# Patient Record
Sex: Female | Born: 1985 | Race: Black or African American | Hispanic: No | Marital: Single | Smoking: Current every day smoker
Health system: Southern US, Community
[De-identification: ages and names within clinical notes are randomized; demographics above are authoritative.]

## PROBLEM LIST (undated history)

## (undated) ENCOUNTER — Inpatient Hospital Stay (HOSPITAL_COMMUNITY): Payer: Self-pay

## (undated) DIAGNOSIS — IMO0002 Reserved for concepts with insufficient information to code with codable children: Secondary | ICD-10-CM

## (undated) DIAGNOSIS — O210 Mild hyperemesis gravidarum: Secondary | ICD-10-CM

## (undated) DIAGNOSIS — J45909 Unspecified asthma, uncomplicated: Secondary | ICD-10-CM

## (undated) DIAGNOSIS — G43909 Migraine, unspecified, not intractable, without status migrainosus: Secondary | ICD-10-CM

## (undated) DIAGNOSIS — R87619 Unspecified abnormal cytological findings in specimens from cervix uteri: Secondary | ICD-10-CM

---
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
## 2007-05-04 ENCOUNTER — Emergency Department (HOSPITAL_COMMUNITY): Admission: EM | Admit: 2007-05-04 | Discharge: 2007-05-04 | Payer: Self-pay | Admitting: Emergency Medicine

## 2007-06-26 ENCOUNTER — Inpatient Hospital Stay (HOSPITAL_COMMUNITY): Admission: AD | Admit: 2007-06-26 | Discharge: 2007-06-26 | Payer: Self-pay | Admitting: Obstetrics and Gynecology

## 2007-07-01 ENCOUNTER — Inpatient Hospital Stay (HOSPITAL_COMMUNITY): Admission: AD | Admit: 2007-07-01 | Discharge: 2007-07-01 | Payer: Self-pay | Admitting: Obstetrics & Gynecology

## 2007-09-08 ENCOUNTER — Inpatient Hospital Stay (HOSPITAL_COMMUNITY): Admission: AD | Admit: 2007-09-08 | Discharge: 2007-09-08 | Payer: Self-pay | Admitting: Family Medicine

## 2007-10-12 ENCOUNTER — Ambulatory Visit (HOSPITAL_COMMUNITY): Admission: RE | Admit: 2007-10-12 | Discharge: 2007-10-12 | Payer: Self-pay | Admitting: Family Medicine

## 2007-12-04 ENCOUNTER — Ambulatory Visit: Payer: Self-pay | Admitting: Obstetrics and Gynecology

## 2007-12-04 ENCOUNTER — Inpatient Hospital Stay (HOSPITAL_COMMUNITY): Admission: AD | Admit: 2007-12-04 | Discharge: 2007-12-04 | Payer: Self-pay | Admitting: Obstetrics and Gynecology

## 2007-12-15 ENCOUNTER — Inpatient Hospital Stay (HOSPITAL_COMMUNITY): Admission: AD | Admit: 2007-12-15 | Discharge: 2007-12-15 | Payer: Self-pay | Admitting: Obstetrics

## 2008-01-18 ENCOUNTER — Inpatient Hospital Stay (HOSPITAL_COMMUNITY): Admission: AD | Admit: 2008-01-18 | Discharge: 2008-01-18 | Payer: Self-pay | Admitting: Obstetrics

## 2008-02-08 ENCOUNTER — Inpatient Hospital Stay (HOSPITAL_COMMUNITY): Admission: RE | Admit: 2008-02-08 | Discharge: 2008-02-11 | Payer: Self-pay | Admitting: Obstetrics

## 2009-01-15 ENCOUNTER — Inpatient Hospital Stay (HOSPITAL_COMMUNITY): Admission: AD | Admit: 2009-01-15 | Discharge: 2009-01-15 | Payer: Self-pay | Admitting: Obstetrics

## 2009-03-04 ENCOUNTER — Ambulatory Visit (HOSPITAL_COMMUNITY): Admission: RE | Admit: 2009-03-04 | Discharge: 2009-03-04 | Payer: Self-pay | Admitting: Family Medicine

## 2010-05-13 LAB — WET PREP, GENITAL: Yeast Wet Prep HPF POC: NONE SEEN

## 2010-05-13 LAB — DIFFERENTIAL
Lymphs Abs: 2.6 10*3/uL (ref 0.7–4.0)
Monocytes Relative: 4 % (ref 3–12)
Neutro Abs: 4.6 10*3/uL (ref 1.7–7.7)
Neutrophils Relative %: 61 % (ref 43–77)

## 2010-05-13 LAB — HEPATITIS B SURFACE ANTIGEN: Hepatitis B Surface Ag: NEGATIVE

## 2010-05-13 LAB — URINALYSIS, ROUTINE W REFLEX MICROSCOPIC
Hgb urine dipstick: NEGATIVE
Nitrite: NEGATIVE
Specific Gravity, Urine: >1.03 — ABNORMAL HIGH (ref 1.005–1.030)
Urobilinogen, UA: 0.2 mg/dL (ref 0.0–1.0)

## 2010-05-13 LAB — SICKLE CELL SCREEN: Sickle Cell Screen: NEGATIVE

## 2010-05-13 LAB — CBC
Platelets: 235 10*3/uL (ref 150–400)
RBC: 3.66 MIL/uL — ABNORMAL LOW (ref 3.87–5.11)
WBC: 7.6 10*3/uL (ref 4.0–10.5)

## 2010-05-13 LAB — TYPE AND SCREEN: ABO/RH(D): O NEG

## 2010-05-13 LAB — HIV ANTIBODY (ROUTINE TESTING W REFLEX): HIV: NONREACTIVE

## 2010-05-13 LAB — GC/CHLAMYDIA PROBE AMP, GENITAL
Chlamydia, DNA Probe: NEGATIVE
GC Probe Amp, Genital: NEGATIVE

## 2010-05-13 LAB — RPR: RPR Ser Ql: NONREACTIVE

## 2010-06-24 NOTE — Op Note (Signed)
NAME:  [name], [name]                 ACCOUNT NO.:  [id number]   MEDICAL RECORD NO.:  [id number]          PATIENT TYPE:  INP   LOCATION:  9138                          FACILITY:  [facility]   PHYSICIAN:  Kathreen Cosier, M.D.DATE OF BIRTH:  [birth year]   DATE OF PROCEDURE:  02/08/2008  DATE OF DISCHARGE:                               OPERATIVE REPORT   PREOPERATIVE DIAGNOSIS:  Previous cesarean section at term desiring  repeat.   POSTOPERATIVE DIAGNOSIS:  Previous cesarean section at term desiring  repeat.   SURGEON:  Kathreen Cosier, MD.   ANESTHESIA:  Spinal.   PROCEDURE:  The patient was placed on the operating table in supine  position after the spinal anesthesia administered.  Abdomen prepped and  draped.  Bladder emptied with a Foley catheter.  Transverse suprapubic  incision made through the old scar carried down through rectus fascia.  Fascia cleaned and incised to length of the incision.  The recti muscles  were retracted laterally.  Peritoneum was incised longitudinally.  Transverse incision made in the visceral peritoneum above the bladder.  Bladder mobilized inferiorly. Transverse lower uterine incision was  made.  The fluid was clear.  The patient delivered from the OP position  of a female, Apgar 9 and 9, weighing 7 pounds 8 ounces.  Placenta fundal  removed manually.  Uterine cavity cleaned with dry laps.  Uterine  incision closed in 1 layer with continuous suture of #1 chromic.  Hemostasis was satisfactory.  Bladder flap reattached with 2-0 chromic.  Uterus well contracted.  Tubes and ovaries normal.  Abdomen closed in  layers.  Peritoneum continuous suture of 0-chromic, fascia continuous  suture of 0-Dexon, and the skin closed with subcuticular stitch of 4-0  Monocryl.  Blood loss 500 mL.  The patient tolerated the procedure well  and taken to recovery room in good condition.           ______________________________  Kathreen Cosier, M.D.     BAM/MEDQ  D:   02/08/2008  T:  02/08/2008  Job:  [id number]

## 2010-06-27 NOTE — Discharge Summary (Signed)
NAME:  [name], [name]                 ACCOUNT NO.:  [id number]   MEDICAL RECORD NO.:  [id number]          PATIENT TYPE:  INP   LOCATION:  9138                          [facility]ILITY:  [facility]   PHYSICIAN:  Kathreen Cosier, M.D.DATE OF BIRTH:  [birth year]   DATE OF ADMISSION:  02/08/2008  DATE OF DISCHARGE:  02/11/2008                               DISCHARGE SUMMARY   The patient is a 25 year old gravida 2, para 1-0-0-1 who had a previous  C-section and was now at term, [facility] on February 14, 2008, and desired  repeat C-section.  She underwent repeat C-section on February 08, 2008,  at a 7-pound 8-ounce female, Apgar 9 and 9.  Placenta was sent to Labor  and Delivery.  There was a nuchal cord x1.  Blood loss was 500 mL.  Postoperatively, she did well.  Her hemoglobin was 8.3.  She was  discharged on the third postoperative day, ambulating, tolerating a  regular diet, on Tylox for pain and ferrous sulfate daily for anemia.   DISCHARGE DIAGNOSIS:  Status post repeat low-transverse cesarean  section, at term.           ______________________________  Kathreen Cosier, M.D.     BAM/MEDQ  D:  03/07/2008  T:  03/07/2008  Job:  [id number]

## 2010-08-06 ENCOUNTER — Inpatient Hospital Stay (INDEPENDENT_AMBULATORY_CARE_PROVIDER_SITE_OTHER)
Admission: RE | Admit: 2010-08-06 | Discharge: 2010-08-06 | Disposition: A | Discharge status: Home / Self Care | Payer: Self-pay | Source: Ambulatory Visit | Attending: Emergency Medicine | Admitting: Emergency Medicine

## 2010-08-06 DIAGNOSIS — R1032 Left lower quadrant pain: Secondary | ICD-10-CM

## 2010-08-06 DIAGNOSIS — N898 Other specified noninflammatory disorders of vagina: Secondary | ICD-10-CM

## 2010-08-06 LAB — POCT URINALYSIS DIP (DEVICE)
Bilirubin Urine: NEGATIVE
Hgb urine dipstick: NEGATIVE
Nitrite: NEGATIVE
Specific Gravity, Urine: 1.025 (ref 1.005–1.030)
Urobilinogen, UA: 0.2 mg/dL (ref 0.0–1.0)
pH: 6.5 (ref 5.0–8.0)

## 2010-08-06 LAB — WET PREP, GENITAL
Trich, Wet Prep: NONE SEEN
Yeast Wet Prep HPF POC: NONE SEEN

## 2010-08-07 LAB — GC/CHLAMYDIA PROBE AMP, GENITAL
Chlamydia, DNA Probe: NEGATIVE
GC Probe Amp, Genital: NEGATIVE

## 2010-11-05 LAB — WET PREP, GENITAL
Trich, Wet Prep: NONE SEEN
Yeast Wet Prep HPF POC: NONE SEEN

## 2010-11-05 LAB — RH IMMUNE GLOBULIN WORKUP (NOT WOMEN'S HOSP)
ABO/RH(D): O NEG
Antibody Screen: NEGATIVE

## 2010-11-05 LAB — CBC
MCV: 97
RBC: 3.84 — ABNORMAL LOW
WBC: 7.4

## 2010-11-05 LAB — HCG, QUANTITATIVE, PREGNANCY: hCG, Beta Chain, Quant, S: 108037 — ABNORMAL HIGH

## 2010-11-07 LAB — URINALYSIS, ROUTINE W REFLEX MICROSCOPIC
Ketones, ur: 40 — AB
Nitrite: NEGATIVE
Protein, ur: NEGATIVE
Urobilinogen, UA: 0.2

## 2010-11-10 LAB — URINALYSIS, ROUTINE W REFLEX MICROSCOPIC
Bilirubin Urine: NEGATIVE
Ketones, ur: NEGATIVE
Nitrite: NEGATIVE
Urobilinogen, UA: 0.2

## 2010-11-11 LAB — RH IMMUNE GLOBULIN WORKUP (NOT WOMEN'S HOSP)

## 2010-11-14 LAB — CBC
Hemoglobin: 8.5 g/dL — ABNORMAL LOW (ref 12.0–15.0)
MCV: 98.1 fL (ref 78.0–100.0)
Platelets: 171 10*3/uL (ref 150–400)
RBC: 2.53 MIL/uL — ABNORMAL LOW (ref 3.87–5.11)
RDW: 12.9 % (ref 11.5–15.5)
RDW: 13.3 % (ref 11.5–15.5)
WBC: 10.6 10*3/uL — ABNORMAL HIGH (ref 4.0–10.5)

## 2010-11-14 LAB — RPR: RPR Ser Ql: NONREACTIVE

## 2011-12-06 ENCOUNTER — Inpatient Hospital Stay (HOSPITAL_COMMUNITY)
Admission: AD | Admit: 2011-12-06 | Discharge: 2011-12-06 | Disposition: A | Discharge status: Home / Self Care | Payer: Medicaid Other | Source: Ambulatory Visit | Attending: Obstetrics | Admitting: Obstetrics

## 2011-12-06 ENCOUNTER — Encounter (HOSPITAL_COMMUNITY): Payer: Self-pay | Admitting: *Deleted

## 2011-12-06 DIAGNOSIS — O210 Mild hyperemesis gravidarum: Secondary | ICD-10-CM | POA: Insufficient documentation

## 2011-12-06 DIAGNOSIS — O26899 Other specified pregnancy related conditions, unspecified trimester: Secondary | ICD-10-CM

## 2011-12-06 DIAGNOSIS — O219 Vomiting of pregnancy, unspecified: Secondary | ICD-10-CM

## 2011-12-06 DIAGNOSIS — O99891 Other specified diseases and conditions complicating pregnancy: Secondary | ICD-10-CM | POA: Insufficient documentation

## 2011-12-06 DIAGNOSIS — R51 Headache: Secondary | ICD-10-CM | POA: Insufficient documentation

## 2011-12-06 HISTORY — DX: Unspecified asthma, uncomplicated: J45.909

## 2011-12-06 HISTORY — DX: Unspecified abnormal cytological findings in specimens from cervix uteri: R87.619

## 2011-12-06 HISTORY — DX: Reserved for concepts with insufficient information to code with codable children: IMO0002

## 2011-12-06 LAB — URINALYSIS, ROUTINE W REFLEX MICROSCOPIC
Hgb urine dipstick: NEGATIVE
Specific Gravity, Urine: 1.025 (ref 1.005–1.030)
Urobilinogen, UA: 0.2 mg/dL (ref 0.0–1.0)

## 2011-12-06 LAB — URINE MICROSCOPIC-ADD ON

## 2011-12-06 LAB — POCT PREGNANCY, URINE: Preg Test, Ur: POSITIVE — AB

## 2011-12-06 MED ORDER — BUTALBITAL-APAP-CAFFEINE 50-325-40 MG PO TABS: 1.0000 | ORAL_TABLET | Freq: Four times a day (QID) | ORAL | Status: DC | PRN

## 2011-12-06 MED ORDER — METOCLOPRAMIDE HCL 5 MG/ML IJ SOLN
10.0000 mg | Freq: Once | INTRAMUSCULAR | Status: AC
  Administered 2011-12-06: 10 mg via INTRAVENOUS
  Filled 2011-12-06: qty 2

## 2011-12-06 MED ORDER — DEXAMETHASONE SODIUM PHOSPHATE 10 MG/ML IJ SOLN
10.0000 mg | Freq: Once | INTRAMUSCULAR | Status: AC
  Administered 2011-12-06: 10 mg via INTRAVENOUS
  Filled 2011-12-06: qty 1

## 2011-12-06 MED ORDER — PROMETHAZINE HCL 25 MG/ML IJ SOLN
12.5000 mg | Freq: Once | INTRAMUSCULAR | Status: AC
  Administered 2011-12-06: 12.5 mg via INTRAVENOUS
  Filled 2011-12-06: qty 2

## 2011-12-06 MED ORDER — LACTATED RINGERS IV BOLUS (SEPSIS)
1000.0000 mL | Freq: Once | INTRAVENOUS | Status: AC
  Administered 2011-12-06: 1000 mL via INTRAVENOUS

## 2011-12-06 MED ORDER — METOCLOPRAMIDE HCL 10 MG PO TABS: 10.0000 mg | ORAL_TABLET | Freq: Four times a day (QID) | ORAL | Status: DC

## 2011-12-06 NOTE — MAU Note (Signed)
Pt reports she ahas had a headache/migraine in her right temple x 3 days. Took aleive but she vomited it up. reprots vision is blurry and unable to keep much of anything down.

## 2011-12-06 NOTE — MAU Provider Note (Signed)
History     CSN: [id number]  Arrival date and time: 12/06/11 1023   None     Chief Complaint  Patient presents with  . Headache   HPI 26 y.o. G5P3003 at [redacted] weeks EGA. Patient's last menstrual period was 10/25/2011.  Headache vomiting blurry vision x 4 day. Feels like migraine, had them in the past, years ago. Tried aleve, couldn't keep it down.    Past Medical History  Diagnosis Date  . Asthma   . Abnormal Pap smear     Past Surgical History  Procedure Date  . Cesarean section     No family history on file.  History  Substance Use Topics  . Smoking status: Current Every Day Smoker  . Smokeless tobacco: Not on file  . Alcohol Use: No    Allergies: No Known Allergies  Prescriptions prior to admission  Medication Sig Dispense Refill  . albuterol (PROVENTIL HFA;VENTOLIN HFA) 108 (90 BASE) MCG/ACT inhaler Inhale 2 puffs into the lungs every 6 (six) hours as needed. Asthma attacks      . beclomethasone (QVAR) 80 MCG/ACT inhaler Inhale 2 puffs into the lungs as needed. asthma      . naproxen sodium (ANAPROX) 220 MG tablet Take 440 mg by mouth daily as needed. pain        Review of Systems  Constitutional: Negative.   Eyes: Positive for blurred vision.  Respiratory: Negative.   Cardiovascular: Negative.   Gastrointestinal: Positive for nausea and vomiting. Negative for abdominal pain, diarrhea and constipation.  Genitourinary: Negative for dysuria, urgency, frequency, hematuria and flank pain.       Negative for vaginal bleeding, vaginal discharge, dyspareunia  Musculoskeletal: Negative.   Neurological: Positive for headaches.  Psychiatric/Behavioral: Negative.    Physical Exam   Blood pressure 104/52, pulse 85, temperature 97.9 F (36.6 C), temperature source Oral, resp. rate 18, height 4' 11.75" (1.518 m), weight 146 lb 9.6 oz (66.497 kg), last menstrual period 10/25/2011.  Physical Exam  Nursing note and vitals reviewed. Constitutional: She is oriented to  person, place, and time. She appears well-developed and well-nourished. No distress.  Cardiovascular: Normal rate.   Respiratory: Effort normal.  GI: Soft. There is no tenderness.  Musculoskeletal: Normal range of motion.  Neurological: She is alert and oriented to person, place, and time.  Skin: Skin is warm and dry.  Psychiatric: She has a normal mood and affect.    MAU Course  Procedures Results for orders placed during the hospital encounter of 12/06/11 (from the past 24 hour(s))  URINALYSIS, ROUTINE W REFLEX MICROSCOPIC     Status: Abnormal   Collection Time   12/06/11 10:25 AM      Component Value Range   Color, Urine YELLOW  YELLOW   APPearance CLEAR  CLEAR   Specific Gravity, Urine 1.025  1.005 - 1.030   pH 6.0  5.0 - 8.0   Glucose, UA NEGATIVE  NEGATIVE mg/dL   Hgb urine dipstick NEGATIVE  NEGATIVE   Bilirubin Urine NEGATIVE  NEGATIVE   Ketones, ur NEGATIVE  NEGATIVE mg/dL   Protein, ur NEGATIVE  NEGATIVE mg/dL   Urobilinogen, UA 0.2  0.0 - 1.0 mg/dL   Nitrite NEGATIVE  NEGATIVE   Leukocytes, UA TRACE (*) NEGATIVE  URINE MICROSCOPIC-ADD ON     Status: Abnormal   Collection Time   12/06/11 10:25 AM      Component Value Range   Squamous Epithelial / LPF FEW (*) RARE   WBC, UA 0-2  <3  WBC/hpf   Bacteria, UA FEW (*) RARE   Urine-Other MUCOUS PRESENT    POCT PREGNANCY, URINE     Status: Abnormal   Collection Time   12/06/11 10:43 AM      Component Value Range   Preg Test, Ur POSITIVE (*) NEGATIVE      . dexamethasone  10 mg Intravenous Once  . lactated ringers  1,000 mL Intravenous Once  . metoCLOPramide (REGLAN) injection  10 mg Intravenous Once  . promethazine  12.5 mg Intravenous Once   Pt feeling much better following IV meds.   Assessment and Plan   1. Headache in pregnancy   2. Nausea and vomiting in pregnancy       Medication List     As of 12/06/2011  6:17 PM    START taking these medications         butalbital-acetaminophen-caffeine  50-325-40 MG per tablet   Commonly known as: FIORICET, ESGIC   Take 1-2 tablets by mouth every 6 (six) hours as needed for headache.      metoCLOPramide 10 MG tablet   Commonly known as: REGLAN   Take 1 tablet (10 mg total) by mouth 4 (four) times daily.      CONTINUE taking these medications         albuterol 108 (90 BASE) MCG/ACT inhaler   Commonly known as: PROVENTIL HFA;VENTOLIN HFA      beclomethasone 80 MCG/ACT inhaler   Commonly known as: QVAR      STOP taking these medications         naproxen sodium 220 MG tablet   Commonly known as: ANAPROX          Where to get your medications    These are the prescriptions that you need to pick up. We sent them to a specific pharmacy, so you will need to go there to get them.   [facility] [address], [place] - [address]    [address] [place] [address]    Phone: [phone]        metoCLOPramide 10 MG tablet         You may get these medications from any pharmacy.         butalbital-acetaminophen-caffeine 50-325-40 MG per tablet            Follow-up Information    Follow up with MARSHALL,BERNARD A, MD. (start prenatal care as soon as possible)    Contact information:   [address] [place] [address] [phone]            FRAZIER,NATALIE 12/06/2011, 10:59 AM

## 2011-12-21 ENCOUNTER — Encounter (HOSPITAL_COMMUNITY): Payer: Self-pay | Admitting: *Deleted

## 2011-12-21 ENCOUNTER — Inpatient Hospital Stay (HOSPITAL_COMMUNITY)
Admission: AD | Admit: 2011-12-21 | Discharge: 2011-12-21 | Discharge status: Against Medical Advice | Payer: Medicaid Other | Source: Ambulatory Visit | Attending: Obstetrics | Admitting: Obstetrics

## 2011-12-21 DIAGNOSIS — G43909 Migraine, unspecified, not intractable, without status migrainosus: Secondary | ICD-10-CM | POA: Insufficient documentation

## 2011-12-21 DIAGNOSIS — O210 Mild hyperemesis gravidarum: Secondary | ICD-10-CM | POA: Insufficient documentation

## 2011-12-21 HISTORY — DX: Migraine, unspecified, not intractable, without status migrainosus: G43.909

## 2011-12-21 HISTORY — DX: Mild hyperemesis gravidarum: O21.0

## 2011-12-21 MED ORDER — PROMETHAZINE HCL 25 MG/ML IJ SOLN
12.5000 mg | Freq: Once | INTRAMUSCULAR | Status: AC
  Administered 2011-12-21: 12.5 mg via INTRAVENOUS
  Filled 2011-12-21: qty 1

## 2011-12-21 MED ORDER — METOCLOPRAMIDE HCL 5 MG/ML IJ SOLN
10.0000 mg | Freq: Once | INTRAMUSCULAR | Status: AC
  Administered 2011-12-21: 10 mg via INTRAVENOUS
  Filled 2011-12-21: qty 2

## 2011-12-21 MED ORDER — LACTATED RINGERS IV SOLN
INTRAVENOUS | Status: DC
  Administered 2011-12-21: 06:00:00 via INTRAVENOUS

## 2011-12-21 MED ORDER — DEXAMETHASONE SODIUM PHOSPHATE 10 MG/ML IJ SOLN: 10.0000 mg | Freq: Once | INTRAMUSCULAR | Status: DC

## 2011-12-21 NOTE — MAU Provider Note (Signed)
  History     CSN: [id number]  Arrival date and time: 12/21/11 7829   None     Chief Complaint  Patient presents with  . Headache   HPI  Pt is a G4P3003 at 8.1 wk IUP here with report of vomiting since 0530 this morning.  Vomiting is associated with headache, described by patient as a "migraine" that has occurred more frequently this pregnancy.  Headache is associated with photophobia.  No report of abdominal pain or vaginal bleeding.    Past Medical History  Diagnosis Date  . Asthma   . Abnormal Pap smear   . Hyperemesis complicating pregnancy, antepartum   . Migraines     Past Surgical History  Procedure Date  . Cesarean section     No family history on file.  History  Substance Use Topics  . Smoking status: Current Every Day Smoker  . Smokeless tobacco: Not on file  . Alcohol Use: No    Allergies: No Known Allergies  Prescriptions prior to admission  Medication Sig Dispense Refill  . albuterol (PROVENTIL HFA;VENTOLIN HFA) 108 (90 BASE) MCG/ACT inhaler Inhale 2 puffs into the lungs every 6 (six) hours as needed. Asthma attacks      . beclomethasone (QVAR) 80 MCG/ACT inhaler Inhale 2 puffs into the lungs as needed. asthma      . butalbital-acetaminophen-caffeine (FIORICET) 50-325-40 MG per tablet Take 1-2 tablets by mouth every 6 (six) hours as needed for headache.  20 tablet  0  . metoCLOPramide (REGLAN) 10 MG tablet Take 1 tablet (10 mg total) by mouth 4 (four) times daily.  30 tablet  1    Review of Systems  Constitutional: Negative for fever.  Eyes: Positive for blurred vision and photophobia. Negative for double vision.  Gastrointestinal: Positive for nausea and vomiting. Negative for abdominal pain.  Neurological: Positive for headaches.  All other systems reviewed and are negative.   Physical Exam   Blood pressure 133/70, pulse 80, temperature 98.3 F (36.8 C), temperature source Oral, resp. rate 20, height 4\' 11"  (1.499 m), weight 68.493 kg (151  lb), last menstrual period 10/25/2011, SpO2 100.00%.  Physical Exam  Constitutional: She is oriented to person, place, and time. She appears well-developed and well-nourished.       Tearful; vomiting multiple times in room.  HENT:  Head: Normocephalic.  Mouth/Throat: Mucous membranes are dry.  Neck: Normal range of motion. Neck supple.  Cardiovascular: Normal rate, regular rhythm and normal heart sounds.   Respiratory: Effort normal and breath sounds normal. No respiratory distress.  GI: Soft. There is no tenderness.  Genitourinary: No bleeding around the vagina. No vaginal discharge found.  Musculoskeletal: Normal range of motion. She exhibits no edema.  Neurological: She is alert and oriented to person, place, and time. She has normal reflexes.  Skin: Skin is warm and dry. She is not diaphoretic.    MAU Course  Procedures  IV LR 1000cc 10 mg IV Reglan 12.5 mg IV Phenergan  Pt vomiting stops, however pt states she wants to leave and begin attempting to take out IV; pt encouraged to stay for medication to help her relax > pt refuses medication.  Husband at bedside states this is her "acting out" and is common.  Pt agrees to stay until 0715 to assess for effects of medication.  Assessment and Plan  Migraine affecting Pregnancy  Pt leaves AMA without signing paperwork. [facility]    [facility] 12/21/2011, 6:12 AM

## 2011-12-21 NOTE — MAU Note (Signed)
Pt once again states she is going to take the IV out, pulling at tape. IV removed. Muhammed CNM at bedside. Pt goes around bed and starts putting her clothes on, continues to state she just wants to leave and go home and rest. Pt then takes a pill bottle out of her purse and takes 2 pills from it and takes them, states it is what we gave her the last time she was here, ask pt not to take meds but pt goes to sink and takes meds, states she is going home and go to bed. Stallings,AC at bedside, pt told she can not leave right after meds are given, pt states we can not keep here because she is fine. Pt 's sig other at bedside as well as provider and Stallings,AC. Discussion between [facility], pt, and pt's husband about hospital police and pt safety and leaving before we can assess effectiveness of meds as well as possible side effects. Eventually pt agrees to stay until 0715.

## 2011-12-21 NOTE — MAU Note (Signed)
After meds given, pt crying harder, twisting in bed, states she just wants to go home, wants IV out and wants to go home, provider at bedside. Reassurance given, attempt to convince pt to stay for further meds as needed and further eval. Pt getting out of bed, demanding to get iv out "or i will take it out myself", continue to attempt to reassure pt, provider and other staff rn at bedside. Pt sig other has gone to lobby for a minute.

## 2011-12-21 NOTE — MAU Note (Signed)
Pt going back and forth to restroom to vomit, heaving and crying at times. Muhammad,CNM at bedside.

## 2011-12-21 NOTE — MAU Note (Signed)
Prior to meds pt crying , states "i just want the pain to go away", rolling in bed.

## 2011-12-21 NOTE — MAU Note (Signed)
Pt walked out of MAU without speaking or acknowledging staff. RN attempted to stop pt to get her to sign papers, however pt ignored RN and walked out the door and got into car at MAU entrance. [facility], CNM informed.

## 2011-12-21 NOTE — MAU Note (Signed)
Pt reports she awakened with a "really, really, really bad headache" about 15 minutes ago, complains of blurred vision. nausea

## 2011-12-29 LAB — OB RESULTS CONSOLE HIV ANTIBODY (ROUTINE TESTING): HIV: NONREACTIVE

## 2011-12-29 LAB — OB RESULTS CONSOLE RPR: RPR: NONREACTIVE

## 2012-06-19 ENCOUNTER — Encounter (HOSPITAL_COMMUNITY): Payer: Self-pay

## 2012-06-19 ENCOUNTER — Inpatient Hospital Stay (HOSPITAL_COMMUNITY)
Admission: AD | Admit: 2012-06-19 | Discharge: 2012-06-19 | Disposition: A | Discharge status: Home / Self Care | Payer: Medicaid Other | Source: Ambulatory Visit | Attending: Obstetrics | Admitting: Obstetrics

## 2012-06-19 DIAGNOSIS — R109 Unspecified abdominal pain: Secondary | ICD-10-CM | POA: Insufficient documentation

## 2012-06-19 DIAGNOSIS — O2673 Subluxation of symphysis (pubis) in the puerperium: Secondary | ICD-10-CM

## 2012-06-19 DIAGNOSIS — O99891 Other specified diseases and conditions complicating pregnancy: Secondary | ICD-10-CM | POA: Insufficient documentation

## 2012-06-19 LAB — URINALYSIS, ROUTINE W REFLEX MICROSCOPIC
Bilirubin Urine: NEGATIVE
Ketones, ur: >80 mg/dL — AB
Nitrite: NEGATIVE
Specific Gravity, Urine: 1.02 (ref 1.005–1.030)
Urobilinogen, UA: 0.2 mg/dL (ref 0.0–1.0)

## 2012-06-19 LAB — WET PREP, GENITAL: Trich, Wet Prep: NONE SEEN

## 2012-06-19 MED ORDER — CYCLOBENZAPRINE HCL 10 MG PO TABS
10.0000 mg | ORAL_TABLET | Freq: Once | ORAL | Status: AC
  Administered 2012-06-19: 10 mg via ORAL
  Filled 2012-06-19: qty 1

## 2012-06-19 NOTE — MAU Provider Note (Signed)
History     CSN: [id number]  Arrival date and time: 06/19/12 2131   First Provider Initiated Contact with Patient 06/19/12 2220      Chief Complaint  Patient presents with  . Abdominal Pain   HPI  [name] is a 27 y.o. G3P2002 at [redacted]w[redacted]d who presents today with pain "right at that bone" (as she points to pubic bone). She states that the pain is worse when she abducts her legs (such as putting on pants/underpants) and states that her boyfriend has to help her get dressed. She rates the pain 8/10 while texting on her phone, and states that it is constant. She took a tylenol #3 today and states that she slept for a few hours, but when she woke up the pain had returned. She denies any VB or LOF and states that the baby has been moving normally.   Past Medical History  Diagnosis Date  . Asthma   . Abnormal Pap smear   . Hyperemesis complicating pregnancy, antepartum   . Migraines     Past Surgical History  Procedure Laterality Date  . Cesarean section      Family History  Problem Relation Age of Onset  . Hypertension Father   . Diabetes Father   . Cancer Maternal Grandmother 56    ovarian  . Cancer Paternal Grandmother     brain tumor    History  Substance Use Topics  . Smoking status: Current Every Day Smoker -- 0.50 packs/day for 13 years    Types: Cigarettes  . Smokeless tobacco: Not on file  . Alcohol Use: No    Allergies:  Allergies  Allergen Reactions  . Latex Swelling and Other (See Comments)    Turn red    Prescriptions prior to admission  Medication Sig Dispense Refill  . acetaminophen-codeine (TYLENOL #3) 300-30 MG per tablet Take 1 tablet by mouth every 4 (four) hours as needed for pain.      . promethazine (PHENERGAN) 25 MG tablet Take 25 mg by mouth every 6 (six) hours as needed for nausea.      Marland Kitchen albuterol (PROVENTIL HFA;VENTOLIN HFA) 108 (90 BASE) MCG/ACT inhaler Inhale 2 puffs into the lungs every 6 (six) hours as needed. Asthma attacks       . beclomethasone (QVAR) 80 MCG/ACT inhaler Inhale 2 puffs into the lungs as needed. asthma        Review of Systems  Constitutional: Negative for fever.  Eyes: Negative for blurred vision.  Respiratory: Negative for shortness of breath.   Cardiovascular: Negative for chest pain.  Gastrointestinal: Positive for abdominal pain (pubis pain). Negative for nausea, vomiting, diarrhea and constipation.  Genitourinary: Negative for dysuria, urgency and frequency.  Musculoskeletal: Negative for myalgias.  Neurological: Negative for dizziness and headaches.   Physical Exam   Blood pressure 116/59, pulse 101, temperature 98 F (36.7 C), resp. rate 18, height 4\' 11"  (1.499 m), weight 72.938 kg (160 lb 12.8 oz), last menstrual period 10/25/2011, SpO2 99.00%.  Physical Exam  Nursing note and vitals reviewed. Constitutional: She is oriented to person, place, and time. She appears well-developed and well-nourished. No distress.  Cardiovascular: Normal rate.   Respiratory: Effort normal.  GI: Soft. She exhibits no distension. There is no tenderness.  Genitourinary:   External: no lesion Cervix: 1/thick/high Uterus: AGA  Neurological: She is alert and oriented to person, place, and time.  Skin: Skin is warm and dry.  Psychiatric: She has a normal mood and affect.  FHT: 140, moderate with 15x15 accels no decels Toco: no UCs  MAU Course  Procedures  Results for orders placed during the hospital encounter of 06/19/12 (from the past 24 hour(s))  URINALYSIS, ROUTINE W REFLEX MICROSCOPIC     Status: Abnormal   Collection Time    06/19/12  9:43 PM      Result Value Range   Color, Urine YELLOW  YELLOW   APPearance CLEAR  CLEAR   Specific Gravity, Urine 1.020  1.005 - 1.030   pH 6.5  5.0 - 8.0   Glucose, UA NEGATIVE  NEGATIVE mg/dL   Hgb urine dipstick TRACE (*) NEGATIVE   Bilirubin Urine NEGATIVE  NEGATIVE   Ketones, ur >80 (*) NEGATIVE mg/dL   Protein, ur NEGATIVE  NEGATIVE mg/dL    Urobilinogen, UA 0.2  0.0 - 1.0 mg/dL   Nitrite NEGATIVE  NEGATIVE   Leukocytes, UA SMALL (*) NEGATIVE  URINE MICROSCOPIC-ADD ON     Status: Abnormal   Collection Time    06/19/12  9:43 PM      Result Value Range   Squamous Epithelial / LPF RARE  RARE   WBC, UA 0-2  <3 WBC/hpf   RBC / HPF 0-2  <3 RBC/hpf   Bacteria, UA FEW (*) RARE     Assessment and Plan   1. 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 Gaynell Face as scheduled Labor precautions given Fetal kick counts  Tawnya Crook 06/19/2012, 10:33 PM

## 2012-06-19 NOTE — MAU Note (Signed)
Constant sharp abdominal pain since this afternoon where c/section scars are. Unsure if having contractions. Denies leaking of fluid or vaginal bleeding. Positive fetal movement.

## 2012-06-23 NOTE — Op Note (Signed)
NAMESAOIRSE, LEGERE                 ACCOUNT NO.:  [id number]  MEDICAL RECORD NO.:  [id number]  LOCATION:  ZO10                          FACILITY:  [facility]  PHYSICIAN:  Marlowe Kays, M.D.  DATE OF BIRTH:  1985-10-26  DATE OF PROCEDURE:  06/22/2012 DATE OF DISCHARGE:  06/19/2012                              OPERATIVE REPORT   PREOPERATIVE DIAGNOSIS:  Pain and swelling, right knee with suspected loose body.  POSTOPERATIVE DIAGNOSIS:  Pain and swelling, right knee with suspected loose body.  OPERATION:  Right knee arthroscopy with generalized joint debridement and removal of suspected loose body.  SURGEON:  Marlowe Kays, M.D.  ASSISTANT:  Nurse.  ANESTHESIA:  General.  PATHOLOGY AND JUSTIFICATION FOR PROCEDURE:  This woman's history was such that she had a badly fractured right patella, which failed to heal (she is a smoker) and I then performed partial patellectomy with repair of the patellar mechanism.  She has done well until roughly 3 to 4 weeks ago when she developed pain and swelling in her knee with no apparent injury.  X-rays in my office demonstrated what appear to be a loose body, which had avulsed off the inferior portion of the patellar repair. It was felt that she had a mechanical problem in the knee and this arthroscopic procedure was indicated for this.  DESCRIPTION OF PROCEDURE:  Satisfactory general anesthesia, Ace wrap and knee support to left lower extremity, pneumatic tourniquet to right lower extremity with the leg Esmarch'ed out nonsterilely and tourniquet inflated to 325 mmHg.  Thigh stabilizer was applied.  Right leg was then prepped with DuraPrep from stabilizer to ankle and draped in sterile field.  Time-out performed.  Superior medial saline inflow.  First, an anterolateral portal, medial compartment knee joint was evaluated.  She had a good bit of reactive fibrosis in the anterior third of the joint, which I cleaned out with 3.5 shaver, went down  to the medial meniscus, which was perhaps slightly scarred but was not frankly torn.  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  Posteriorly, she had normal femoral condyle and normal medial meniscus.  Looking at the medial gutter and suprapatellar area, I found a large plica or a band type structure and also some wear on the articular surface of the patella.  It was difficult to get to these structures from this portal.  Consequently, I reversed portals.  She had a good bit of reactive fibrosis in the anterolateral joint and anterior to the ACL.  I cleaned all this out with the 3.5 shaver.  During this process, the suction apparatus did clot at one point, which made [place] suspicious that the fragment may have been evacuated.  Her ACL was intact.  She did have some bleeding anterior to the ACL at the femur and I used the ArthroCare vaporizer to eradicate this.  I then looked up in the lateral gutter and suprapatellar area.  I shaved the medial facet of the patella and then used the ArthroCare vaporizer to remove the postsurgical plica and soft tissue in the anterior medial aspect of her  joint until all this was cleaned out.  Since I could not definitely say I had found a fragment, I took a lateral x-ray with portable C-arm and did not see any remaining fragment in the joint.  This concluded to my satisfaction that we had accomplished the desired goals of the surgery. The knee joint was irrigated to clear and all fluid possibly removed.  I closed the 2 entry portals with 4-0 nylon and then injected 20 mL of 0.5% Marcaine with adrenaline and 4 mg of morphine through the inflow apparatus, which I closed with 4-0 nylon as well.  Betadine, Adaptic, dry sterile dressing were applied. Tourniquet was released.  She tolerated the procedure well and was taken to the recovery room in  satisfactory condition with no known complications.          ______________________________ Marlowe Kays, M.D.     JA/MEDQ  D:  06/22/2012  T:  06/23/2012  Job:  [id number]

## 2012-06-28 ENCOUNTER — Other Ambulatory Visit: Payer: Self-pay | Admitting: Obstetrics

## 2012-07-13 MED ORDER — FENTANYL CITRATE 0.05 MG/ML IJ SOLN
INTRAMUSCULAR | Status: AC
  Filled 2012-07-13: qty 2

## 2012-07-13 MED ORDER — MORPHINE SULFATE 0.5 MG/ML IJ SOLN
INTRAMUSCULAR | Status: AC
  Filled 2012-07-13: qty 10

## 2012-07-20 ENCOUNTER — Encounter (HOSPITAL_COMMUNITY): Payer: Self-pay | Admitting: Pharmacist

## 2012-07-23 NOTE — H&P (Signed)
NAME:  [name], [name] NO.:  [id number]  MEDICAL RECORD NO.:  [id number]  LOCATION:  PERIO                         FACILITY:  [facility]  PHYSICIAN:  Kathreen Cosier, M.D.DATE OF BIRTH:  1985-02-10  DATE OF ADMISSION:  06/19/2012 DATE OF DISCHARGE:                             HISTORY & PHYSICAL   HISTORY OF PRESENT ILLNESS:  The patient is a 27 year old, gravida 3, para 2-0-0-2, who had 2 previous C-sections.  Her due date is July 29, 2012.  She is O negative and received RhoGAM.  The patient is in for repeat C-section.  PAST MEDICAL HISTORY:  Negative.  PAST SURGICAL HISTORY:  Two C-sections in the past.  Negative GBS.  SOCIAL HISTORY:  She smokes half a pack of cigarettes a day.  REVIEW OF SYSTEMS:  Noncontributory.  PHYSICAL EXAMINATION:  GENERAL:  Well-developed female, in no distress. HEENT:  Negative. LUNGS:  Clear to P and A. HEART:  Regular rhythm.  No murmurs, no gallops. BREASTS:  Negative. ABDOMEN:  Term. PELVIC:  Cervix closed. EXTREMITIES:  Legs negative.          ______________________________ Kathreen Cosier, M.D.     BAM/MEDQ  D:  07/22/2012  T:  07/23/2012  Job:  [id number]

## 2012-07-25 ENCOUNTER — Encounter (HOSPITAL_COMMUNITY): Payer: Self-pay

## 2012-07-25 ENCOUNTER — Encounter (HOSPITAL_COMMUNITY)
Admission: RE | Admit: 2012-07-25 | Discharge: 2012-07-25 | Disposition: A | Discharge status: Home / Self Care | Payer: Medicaid Other | Source: Ambulatory Visit | Attending: Obstetrics | Admitting: Obstetrics

## 2012-07-25 LAB — CBC
MCHC: 33.8 g/dL (ref 30.0–36.0)
RDW: 12.7 % (ref 11.5–15.5)

## 2012-07-25 LAB — TYPE AND SCREEN: ABO/RH(D): O NEG

## 2012-07-25 NOTE — Patient Instructions (Addendum)
20 [name]  07/25/2012   Your procedure is scheduled on:  07/26/12  Enter through the Main Entrance of [facility] at 3PM/ AM.  Pick up the phone at the desk and dial [phone].   Call this number if you have problems the morning of surgery: [phone]   Remember:   Do not eat food:After Midnight.  Do not drink clear liquids: 4 Hours before arrival.  Take these medicines the morning of surgery with A SIP OF WATER: NA   Do not wear jewelry, make-up or nail polish.  Do not wear lotions, powders, or perfumes. You may wear deodorant.  Do not shave 48 hours prior to surgery.  Do not bring valuables to the hospital.  [facility] is not responsible                  for any belongings or valuables brought to the hospital.  Contacts, dentures or bridgework may not be worn into surgery.  Leave suitcase in the car. After surgery it may be brought to your room.  For patients admitted to the hospital, checkout time is 11:00 AM the day of                discharge.   Patients discharged the day of surgery will not be allowed to drive                   home.  Name and phone number of your driver: NA  Special Instructions: Shower using CHG 2 nights before surgery and the night before surgery.  If you shower the day of surgery use CHG.  Use special wash - you have one bottle of CHG for all showers.  You should use approximately 1/3 of the bottle for each shower.   Please read over the following fact sheets that you were given: Surgical Site Infection Prevention

## 2012-07-26 ENCOUNTER — Inpatient Hospital Stay (HOSPITAL_COMMUNITY)
Admission: RE | Admit: 2012-07-26 | Discharge: 2012-07-29 | DRG: 766 | Disposition: A | Discharge status: Home / Self Care | Payer: Medicaid Other | Source: Ambulatory Visit | Attending: Obstetrics | Admitting: Obstetrics

## 2012-07-26 ENCOUNTER — Encounter: Payer: Medicaid Other | Admitting: Obstetrics

## 2012-07-26 ENCOUNTER — Inpatient Hospital Stay (HOSPITAL_COMMUNITY): Payer: Medicaid Other | Admitting: Anesthesiology

## 2012-07-26 ENCOUNTER — Encounter (HOSPITAL_COMMUNITY)
Admission: RE | Disposition: A | Discharge status: Home / Self Care | Payer: Self-pay | Source: Ambulatory Visit | Attending: Obstetrics

## 2012-07-26 ENCOUNTER — Encounter (HOSPITAL_COMMUNITY): Payer: Self-pay | Admitting: Anesthesiology

## 2012-07-26 DIAGNOSIS — O34219 Maternal care for unspecified type scar from previous cesarean delivery: Principal | ICD-10-CM | POA: Diagnosis present

## 2012-07-26 DIAGNOSIS — O99334 Smoking (tobacco) complicating childbirth: Secondary | ICD-10-CM | POA: Diagnosis present

## 2012-07-26 LAB — RPR: RPR Ser Ql: NONREACTIVE

## 2012-07-26 SURGERY — Surgical Case
Anesthesia: Spinal | Site: Abdomen | Wound class: Clean Contaminated

## 2012-07-26 MED ORDER — NALBUPHINE SYRINGE 5 MG/0.5 ML
INJECTION | INTRAMUSCULAR | Status: AC
  Filled 2012-07-26: qty 0.5

## 2012-07-26 MED ORDER — MENTHOL 3 MG MT LOZG: 1.0000 | LOZENGE | OROMUCOSAL | Status: DC | PRN

## 2012-07-26 MED ORDER — LACTATED RINGERS IV SOLN
INTRAVENOUS | Status: DC
  Administered 2012-07-26: 21:00:00 via INTRAVENOUS

## 2012-07-26 MED ORDER — SIMETHICONE 80 MG PO CHEW
80.0000 mg | CHEWABLE_TABLET | Freq: Three times a day (TID) | ORAL | Status: DC
  Administered 2012-07-27 – 2012-07-29 (×6): 80 mg via ORAL

## 2012-07-26 MED ORDER — FENTANYL CITRATE 0.05 MG/ML IJ SOLN
INTRAMUSCULAR | Status: DC | PRN
  Administered 2012-07-26: 75 ug via INTRAVENOUS
  Administered 2012-07-26 (×2): 50 ug via INTRAVENOUS

## 2012-07-26 MED ORDER — HYDROMORPHONE HCL PF 1 MG/ML IJ SOLN
INTRAMUSCULAR | Status: AC
  Filled 2012-07-26: qty 1

## 2012-07-26 MED ORDER — DIPHENHYDRAMINE HCL 25 MG PO CAPS
25.0000 mg | ORAL_CAPSULE | ORAL | Status: DC | PRN
  Filled 2012-07-26: qty 1

## 2012-07-26 MED ORDER — OXYTOCIN 40 UNITS IN LACTATED RINGERS INFUSION - SIMPLE MED: 62.5000 mL/h | INTRAVENOUS | Status: AC

## 2012-07-26 MED ORDER — MEPERIDINE HCL 25 MG/ML IJ SOLN: 6.2500 mg | INTRAMUSCULAR | Status: DC | PRN

## 2012-07-26 MED ORDER — SIMETHICONE 80 MG PO CHEW
80.0000 mg | CHEWABLE_TABLET | ORAL | Status: DC | PRN
  Administered 2012-07-27 – 2012-07-28 (×2): 80 mg via ORAL

## 2012-07-26 MED ORDER — MORPHINE SULFATE (PF) 0.5 MG/ML IJ SOLN
INTRAMUSCULAR | Status: DC | PRN
  Administered 2012-07-26: .15 mg via INTRATHECAL

## 2012-07-26 MED ORDER — KETOROLAC TROMETHAMINE 30 MG/ML IJ SOLN
30.0000 mg | Freq: Four times a day (QID) | INTRAMUSCULAR | Status: AC | PRN
  Administered 2012-07-26: 30 mg via INTRAVENOUS

## 2012-07-26 MED ORDER — DIBUCAINE 1 % RE OINT
1.0000 "application " | TOPICAL_OINTMENT | RECTAL | Status: DC | PRN
  Administered 2012-07-27 – 2012-07-28 (×2): 1 via RECTAL
  Filled 2012-07-26 (×2): qty 28

## 2012-07-26 MED ORDER — ONDANSETRON HCL 4 MG/2ML IJ SOLN: 4.0000 mg | Freq: Three times a day (TID) | INTRAMUSCULAR | Status: DC | PRN

## 2012-07-26 MED ORDER — FENTANYL CITRATE 0.05 MG/ML IJ SOLN
INTRAMUSCULAR | Status: AC
  Filled 2012-07-26: qty 2

## 2012-07-26 MED ORDER — PRENATAL MULTIVITAMIN CH
1.0000 | ORAL_TABLET | Freq: Every day | ORAL | Status: DC
  Administered 2012-07-27 – 2012-07-28 (×2): 1 via ORAL
  Filled 2012-07-26 (×2): qty 1

## 2012-07-26 MED ORDER — SODIUM CHLORIDE 0.9 % IJ SOLN: 3.0000 mL | INTRAMUSCULAR | Status: DC | PRN

## 2012-07-26 MED ORDER — SCOPOLAMINE 1 MG/3DAYS TD PT72
MEDICATED_PATCH | TRANSDERMAL | Status: AC
  Administered 2012-07-26: 1.5 mg via TRANSDERMAL
  Filled 2012-07-26: qty 1

## 2012-07-26 MED ORDER — ONDANSETRON HCL 4 MG PO TABS: 4.0000 mg | ORAL_TABLET | ORAL | Status: DC | PRN

## 2012-07-26 MED ORDER — OXYCODONE-ACETAMINOPHEN 5-325 MG PO TABS
1.0000 | ORAL_TABLET | ORAL | Status: DC | PRN
  Administered 2012-07-27 – 2012-07-28 (×4): 1 via ORAL
  Administered 2012-07-28 (×4): 2 via ORAL
  Administered 2012-07-29: 1 via ORAL
  Filled 2012-07-26 (×8): qty 1
  Filled 2012-07-26 (×2): qty 2
  Filled 2012-07-26: qty 1

## 2012-07-26 MED ORDER — ONDANSETRON HCL 4 MG/2ML IJ SOLN
INTRAMUSCULAR | Status: DC | PRN
  Administered 2012-07-26: 4 mg via INTRAVENOUS

## 2012-07-26 MED ORDER — ONDANSETRON HCL 4 MG/2ML IJ SOLN: 4.0000 mg | INTRAMUSCULAR | Status: DC | PRN

## 2012-07-26 MED ORDER — KETOROLAC TROMETHAMINE 30 MG/ML IJ SOLN
INTRAMUSCULAR | Status: AC
  Filled 2012-07-26: qty 1

## 2012-07-26 MED ORDER — LACTATED RINGERS IV SOLN
40.0000 [IU] | INTRAVENOUS | Status: DC | PRN
  Administered 2012-07-26: 40 [IU] via INTRAVENOUS

## 2012-07-26 MED ORDER — NALOXONE HCL 0.4 MG/ML IJ SOLN: 0.4000 mg | INTRAMUSCULAR | Status: DC | PRN

## 2012-07-26 MED ORDER — WITCH HAZEL-GLYCERIN EX PADS
1.0000 "application " | MEDICATED_PAD | CUTANEOUS | Status: DC | PRN
  Administered 2012-07-27: 1 via TOPICAL

## 2012-07-26 MED ORDER — SCOPOLAMINE 1 MG/3DAYS TD PT72
1.0000 | MEDICATED_PATCH | Freq: Once | TRANSDERMAL | Status: DC
  Administered 2012-07-26: 1.5 mg via TRANSDERMAL

## 2012-07-26 MED ORDER — PHENYLEPHRINE 40 MCG/ML (10ML) SYRINGE FOR IV PUSH (FOR BLOOD PRESSURE SUPPORT)
PREFILLED_SYRINGE | INTRAVENOUS | Status: AC
  Filled 2012-07-26: qty 5

## 2012-07-26 MED ORDER — EPHEDRINE 5 MG/ML INJ
INTRAVENOUS | Status: AC
  Filled 2012-07-26: qty 10

## 2012-07-26 MED ORDER — MORPHINE SULFATE (PF) 0.5 MG/ML IJ SOLN
INTRAMUSCULAR | Status: DC | PRN
  Administered 2012-07-26 (×2): 1 mg via EPIDURAL
  Administered 2012-07-26: 2 mg via EPIDURAL
  Administered 2012-07-26: 1 mg via EPIDURAL

## 2012-07-26 MED ORDER — BUPIVACAINE IN DEXTROSE 0.75-8.25 % IT SOLN
INTRATHECAL | Status: DC | PRN
  Administered 2012-07-26: 10 mg via INTRATHECAL

## 2012-07-26 MED ORDER — FENTANYL CITRATE 0.05 MG/ML IJ SOLN
INTRAMUSCULAR | Status: DC | PRN
  Administered 2012-07-26: 25 ug via INTRATHECAL

## 2012-07-26 MED ORDER — LANOLIN HYDROUS EX OINT: 1.0000 "application " | TOPICAL_OINTMENT | CUTANEOUS | Status: DC | PRN

## 2012-07-26 MED ORDER — METOCLOPRAMIDE HCL 5 MG/ML IJ SOLN
INTRAMUSCULAR | Status: AC
  Administered 2012-07-26: 10 mg
  Filled 2012-07-26: qty 2

## 2012-07-26 MED ORDER — DEXAMETHASONE SODIUM PHOSPHATE 10 MG/ML IJ SOLN
INTRAMUSCULAR | Status: DC | PRN
  Administered 2012-07-26: 10 mg via INTRAVENOUS

## 2012-07-26 MED ORDER — CEFAZOLIN SODIUM-DEXTROSE 2-3 GM-% IV SOLR
INTRAVENOUS | Status: AC
  Filled 2012-07-26: qty 50

## 2012-07-26 MED ORDER — DIPHENHYDRAMINE HCL 50 MG/ML IJ SOLN: 25.0000 mg | INTRAMUSCULAR | Status: DC | PRN

## 2012-07-26 MED ORDER — FENTANYL CITRATE 0.05 MG/ML IJ SOLN
25.0000 ug | INTRAMUSCULAR | Status: DC | PRN
  Administered 2012-07-26: 50 ug via INTRAVENOUS

## 2012-07-26 MED ORDER — PHENYLEPHRINE HCL 10 MG/ML IJ SOLN
INTRAMUSCULAR | Status: DC | PRN
  Administered 2012-07-26 (×2): 80 ug via INTRAVENOUS
  Administered 2012-07-26: 40 ug via INTRAVENOUS

## 2012-07-26 MED ORDER — DIPHENHYDRAMINE HCL 25 MG PO CAPS
25.0000 mg | ORAL_CAPSULE | Freq: Four times a day (QID) | ORAL | Status: DC | PRN
  Administered 2012-07-27: 25 mg via ORAL

## 2012-07-26 MED ORDER — SENNOSIDES-DOCUSATE SODIUM 8.6-50 MG PO TABS
2.0000 | ORAL_TABLET | Freq: Every day | ORAL | Status: DC
  Administered 2012-07-27 – 2012-07-28 (×2): 2 via ORAL

## 2012-07-26 MED ORDER — IBUPROFEN 600 MG PO TABS
600.0000 mg | ORAL_TABLET | Freq: Four times a day (QID) | ORAL | Status: DC
  Administered 2012-07-27 – 2012-07-29 (×10): 600 mg via ORAL
  Filled 2012-07-26 (×10): qty 1

## 2012-07-26 MED ORDER — METHYLERGONOVINE MALEATE 0.2 MG/ML IJ SOLN
INTRAMUSCULAR | Status: DC | PRN
  Administered 2012-07-26: 0.2 mg via INTRAMUSCULAR

## 2012-07-26 MED ORDER — CEFAZOLIN SODIUM-DEXTROSE 2-3 GM-% IV SOLR
2.0000 g | Freq: Once | INTRAVENOUS | Status: AC
  Administered 2012-07-26: 2 g via INTRAVENOUS

## 2012-07-26 MED ORDER — NALBUPHINE SYRINGE 5 MG/0.5 ML
5.0000 mg | INJECTION | INTRAMUSCULAR | Status: DC | PRN
  Administered 2012-07-26: 5 mg via INTRAVENOUS
  Filled 2012-07-26: qty 1

## 2012-07-26 MED ORDER — TETANUS-DIPHTH-ACELL PERTUSSIS 5-2.5-18.5 LF-MCG/0.5 IM SUSP
0.5000 mL | Freq: Once | INTRAMUSCULAR | Status: AC
  Administered 2012-07-28: 0.5 mL via INTRAMUSCULAR

## 2012-07-26 MED ORDER — NALBUPHINE SYRINGE 5 MG/0.5 ML
5.0000 mg | INJECTION | INTRAMUSCULAR | Status: DC | PRN
  Filled 2012-07-26: qty 1

## 2012-07-26 MED ORDER — ZOLPIDEM TARTRATE 5 MG PO TABS: 5.0000 mg | ORAL_TABLET | Freq: Every evening | ORAL | Status: DC | PRN

## 2012-07-26 MED ORDER — DIPHENHYDRAMINE HCL 50 MG/ML IJ SOLN
INTRAMUSCULAR | Status: DC | PRN
  Administered 2012-07-26: 12.5 mg via INTRAVENOUS

## 2012-07-26 MED ORDER — DIPHENHYDRAMINE HCL 50 MG/ML IJ SOLN: 12.5000 mg | INTRAMUSCULAR | Status: DC | PRN

## 2012-07-26 MED ORDER — LACTATED RINGERS IV SOLN
INTRAVENOUS | Status: DC | PRN
  Administered 2012-07-26: 18:00:00 via INTRAVENOUS

## 2012-07-26 MED ORDER — NALOXONE HCL 1 MG/ML IJ SOLN
1.0000 ug/kg/h | INTRAMUSCULAR | Status: DC | PRN
  Filled 2012-07-26: qty 2

## 2012-07-26 MED ORDER — OXYTOCIN 10 UNIT/ML IJ SOLN
INTRAMUSCULAR | Status: AC
  Filled 2012-07-26: qty 4

## 2012-07-26 MED ORDER — LACTATED RINGERS IV SOLN
INTRAVENOUS | Status: DC | PRN
  Administered 2012-07-26 (×3): via INTRAVENOUS

## 2012-07-26 MED ORDER — EPHEDRINE SULFATE 50 MG/ML IJ SOLN
INTRAMUSCULAR | Status: DC | PRN
  Administered 2012-07-26 (×6): 5 mg via INTRAVENOUS
  Administered 2012-07-26: 10 mg via INTRAVENOUS
  Administered 2012-07-26 (×2): 5 mg via INTRAVENOUS

## 2012-07-26 MED ORDER — KETOROLAC TROMETHAMINE 30 MG/ML IJ SOLN: 30.0000 mg | Freq: Four times a day (QID) | INTRAMUSCULAR | Status: AC | PRN

## 2012-07-26 MED ORDER — METOCLOPRAMIDE HCL 5 MG/ML IJ SOLN: 10.0000 mg | Freq: Three times a day (TID) | INTRAMUSCULAR | Status: DC | PRN

## 2012-07-26 MED ORDER — METHYLERGONOVINE MALEATE 0.2 MG/ML IJ SOLN
INTRAMUSCULAR | Status: AC
  Filled 2012-07-26: qty 1

## 2012-07-26 MED ORDER — DIPHENHYDRAMINE HCL 50 MG/ML IJ SOLN
INTRAMUSCULAR | Status: AC
  Filled 2012-07-26: qty 1

## 2012-07-26 MED ORDER — DEXAMETHASONE SODIUM PHOSPHATE 10 MG/ML IJ SOLN
INTRAMUSCULAR | Status: AC
  Filled 2012-07-26: qty 1

## 2012-07-26 MED ORDER — LACTATED RINGERS IV SOLN
Freq: Once | INTRAVENOUS | Status: AC
  Administered 2012-07-26: 15:00:00 via INTRAVENOUS

## 2012-07-26 MED ORDER — MORPHINE SULFATE 0.5 MG/ML IJ SOLN
INTRAMUSCULAR | Status: AC
  Filled 2012-07-26: qty 10

## 2012-07-26 MED ORDER — ONDANSETRON HCL 4 MG/2ML IJ SOLN
INTRAMUSCULAR | Status: AC
  Filled 2012-07-26: qty 2

## 2012-07-26 SURGICAL SUPPLY — 33 items
ADH SKN CLS APL DERMABOND .7 (GAUZE/BANDAGES/DRESSINGS) ×1
CLAMP CORD UMBIL (MISCELLANEOUS) IMPLANT
CLOTH BEACON ORANGE TIMEOUT ST (SAFETY) ×2 IMPLANT
DERMABOND ADVANCED (GAUZE/BANDAGES/DRESSINGS) ×1
DERMABOND ADVANCED .7 DNX12 (GAUZE/BANDAGES/DRESSINGS) ×1 IMPLANT
DRAPE LG THREE QUARTER DISP (DRAPES) ×2 IMPLANT
DRSG OPSITE POSTOP 4X10 (GAUZE/BANDAGES/DRESSINGS) ×2 IMPLANT
DURAPREP 26ML APPLICATOR (WOUND CARE) ×2 IMPLANT
ELECT REM PT RETURN 9FT ADLT (ELECTROSURGICAL) ×2
ELECTRODE REM PT RTRN 9FT ADLT (ELECTROSURGICAL) ×1 IMPLANT
EXTRACTOR VACUUM M CUP 4 TUBE (SUCTIONS) IMPLANT
GLOVE BIO SURGEON STRL SZ8.5 (GLOVE) ×2 IMPLANT
GOWN PREVENTION PLUS XXLARGE (GOWN DISPOSABLE) ×2 IMPLANT
GOWN STRL REIN XL XLG (GOWN DISPOSABLE) ×4 IMPLANT
KIT ABG SYR 3ML LUER SLIP (SYRINGE) IMPLANT
NDL HYPO 25X5/8 SAFETYGLIDE (NEEDLE) ×1 IMPLANT
NEEDLE HYPO 25X5/8 SAFETYGLIDE (NEEDLE) ×2 IMPLANT
NS IRRIG 1000ML POUR BTL (IV SOLUTION) ×2 IMPLANT
PACK C SECTION WH (CUSTOM PROCEDURE TRAY) ×2 IMPLANT
PAD OB MATERNITY 4.3X12.25 (PERSONAL CARE ITEMS) ×2 IMPLANT
SUT CHROMIC 0 CT 802H (SUTURE) ×2 IMPLANT
SUT CHROMIC 1 CTX 36 (SUTURE) ×6 IMPLANT
SUT CHROMIC 2 0 SH (SUTURE) ×2 IMPLANT
SUT GUT PLAIN 0 CT-3 TAN 27 (SUTURE) IMPLANT
SUT MON AB 4-0 PS1 27 (SUTURE) ×2 IMPLANT
SUT PLAIN 2 0 XLH (SUTURE) ×2 IMPLANT
SUT VIC AB 0 CT1 18XCR BRD8 (SUTURE) IMPLANT
SUT VIC AB 0 CT1 8-18 (SUTURE)
SUT VIC AB 0 CTX 36 (SUTURE) ×4
SUT VIC AB 0 CTX36XBRD ANBCTRL (SUTURE) ×2 IMPLANT
TOWEL OR 17X24 6PK STRL BLUE (TOWEL DISPOSABLE) ×6 IMPLANT
TRAY FOLEY CATH 14FR (SET/KITS/TRAYS/PACK) ×2 IMPLANT
WATER STERILE IRR 1000ML POUR (IV SOLUTION) ×2 IMPLANT

## 2012-07-26 NOTE — Op Note (Signed)
preop diagnosis previous C-section x2 at 39+ weeks for repeat C-section Postop diagnosis repeat low transverse transverse cesarean section and repair of uterus Anesthesia spinal Surgeon Dr. Francoise Ceo First assistant Dr. Coral Ceo Procedure patient placed on the operating table in the supine position after the spinal  adminisrered abdomen prepped and draped bladder emptied with a Foley catheter a transverse suprapubic incision made through the old scar carried him to the rectus fascia fascia cleaned and incised the length of the incision recti muscles separated and on attempt  To open the peritoneum  the peritoneum was noted that the uterus was intimately adherent to the abdominal wall this was separated with blunt and sharp dissection resulting in multiple small lacerations to the serosa of the uterus a transverse low uterine incision   and fluid clear patient delivered with the vacuum of a female Apgar 2 and 8  the team was in attendance the placenta was removed manually and sent to pathology uterine cavity clean with dry laps the uterine incision was closed in one layer with continuous suture of #1 chromic hemostasis was satisfactory there are multiple lacerations to the uterine wall as a result of entering the abdomen and using continuous and interrupted sutures of #1 chromic the lacerations on the uterus were repaired abdomen closed in layers the muscle closed with 0 chromic fascia closed with 0 Vicryl continuous skin closed with 4-0 Monocryl blood loss was 1200 cc patient tolerated the procedure well

## 2012-07-26 NOTE — Anesthesia Postprocedure Evaluation (Signed)
  Anesthesia Post-op Note  Anesthesia Post Note  Patient: [name]  Procedure(s) Performed: Procedure(s) (LRB): REPEAT CESAREAN SECTION (N/A)  Anesthesia type: Spinal  Patient location: PACU  Post pain: Pain level controlled  Post assessment: Post-op Vital signs reviewed  Last Vitals:  Filed Vitals:   07/26/12 2115  BP: 105/68  Pulse: 66  Temp: 36.5 C  Resp: 17    Post vital signs: Reviewed  Level of consciousness: awake  Complications: No apparent anesthesia complications

## 2012-07-26 NOTE — Anesthesia Preprocedure Evaluation (Signed)
Anesthesia Evaluation  Patient identified by MRN, date of birth, ID band Patient awake    Reviewed: Allergy & Precautions, H&P , NPO status , Patient's Chart, lab work & pertinent test results  Airway Mallampati: III TM Distance: >3 FB Neck ROM: Full    Dental no notable dental hx. (+) Teeth Intact   Pulmonary asthma , Current Smoker,  breath sounds clear to auscultation  Pulmonary exam normal       Cardiovascular negative cardio ROS  Rhythm:Regular Rate:Normal     Neuro/Psych  Headaches, negative psych ROS   GI/Hepatic negative GI ROS, Neg liver ROS, Hyperemesis   Endo/Other  negative endocrine ROS  Renal/GU negative Renal ROS  negative genitourinary   Musculoskeletal negative musculoskeletal ROS (+)   Abdominal (+) + obese,   Peds  Hematology negative hematology ROS (+)   Anesthesia Other Findings   Reproductive/Obstetrics Previous C/Section                           Anesthesia Physical Anesthesia Plan  ASA: II  Anesthesia Plan: Spinal   Post-op Pain Management:    Induction:   Airway Management Planned: Natural Airway  Additional Equipment:   Intra-op Plan:   Post-operative Plan:   Informed Consent: I have reviewed the patients History and Physical, chart, labs and discussed the procedure including the risks, benefits and alternatives for the proposed anesthesia with the patient or authorized representative who has indicated his/her understanding and acceptance.     Plan Discussed with: CRNA, Surgeon and Anesthesiologist  Anesthesia Plan Comments:         Anesthesia Quick Evaluation

## 2012-07-26 NOTE — H&P (Signed)
  There has been no change in her history and physical since her original dictation

## 2012-07-26 NOTE — Consult Note (Signed)
Neonatology Note:  Attendance at C-section:  I was asked by Dr. Marshall to attend this repeat C/S at term. The mother is a G3P2 O neg, GBS neg smoker (1/2 pack/day). ROM at delivery, fluid clear. There was some difficulty getting the baby's head delivered, vacuum-assisted. Infant was floppy, blue, and trying to cry, but had copious, clear secretions present obstructing his airway. We bulb suctioned for large amounts of fluid and gave stimulation. His HR was intermittently below 80, but when we could get his airway clear of fluid, he would cry and the HR would come right up. We DeLee suctioned twice, getting about 6 ml clear mucous. He gradually improved and did not require BBO2. Perfusion good. We did chest PT due to crackling rales bilaterally, with marked improvement after that. Ap 3/8/8. Lungs clear to ausc in DR. Tone still decreased in upper extremities at 10 min of life, but color pink, breathing regular, so allowed to remain with parents for skin to skin time, with his nurse observing him closely. I instructed her to take him to CN if any further problems arise.To CN to care of Pediatrician.  Duke Weisensel C. Soul Deveney, MD  

## 2012-07-26 NOTE — Transfer of Care (Signed)
Immediate Anesthesia Transfer of Care Note  Patient: [name]  Procedure(s) Performed: Procedure(s): REPEAT CESAREAN SECTION (N/A)  Patient Location: PACU  Anesthesia Type:Spinal  Level of Consciousness: awake  Airway & Oxygen Therapy: Patient Spontanous Breathing  Post-op Assessment: Report given to PACU RN  Post vital signs: Reviewed and stable  Complications: No apparent anesthesia complications

## 2012-07-26 NOTE — Anesthesia Procedure Notes (Signed)
Spinal  Patient location during procedure: OR Start time: 07/26/2012 5:12 PM Staffing Anesthesiologist: Laela Deviney A. Performed by: anesthesiologist  Preanesthetic Checklist Completed: patient identified, site marked, surgical consent, pre-op evaluation, timeout performed, IV checked, risks and benefits discussed and monitors and equipment checked Spinal Block Patient position: sitting Prep: site prepped and draped and DuraPrep Patient monitoring: heart rate, cardiac monitor, continuous pulse ox and blood pressure Approach: midline Location: L3-4 Injection technique: single-shot Needle Needle type: Sprotte  Needle gauge: 24 G Needle length: 9 cm Needle insertion depth: 4 cm Assessment Sensory level: T4 Additional Notes Patient tolerated procedure well. Adequate sensory block.

## 2012-07-27 ENCOUNTER — Encounter (HOSPITAL_COMMUNITY): Payer: Self-pay | Admitting: Obstetrics

## 2012-07-27 LAB — CBC
MCH: 31.5 pg (ref 26.0–34.0)
MCHC: 34 g/dL (ref 30.0–36.0)
MCV: 92.6 fL (ref 78.0–100.0)
Platelets: 152 10*3/uL (ref 150–400)
RBC: 2.57 MIL/uL — ABNORMAL LOW (ref 3.87–5.11)
RDW: 12.6 % (ref 11.5–15.5)

## 2012-07-27 LAB — BIRTH TISSUE RECOVERY COLLECTION (PLACENTA DONATION)

## 2012-07-27 NOTE — Progress Notes (Signed)
Patiepostop day 1 Vital signs normal Incision clean and dry Legs negative Lochia moderate doing wellnt ID: Marcine Matar, female   DOB: [birth year], 27 y.o.   MRN:

## 2012-07-27 NOTE — Anesthesia Postprocedure Evaluation (Signed)
  Anesthesia Post-op Note  Patient: [name]  Procedure(s) Performed: Procedure(s): REPEAT CESAREAN SECTION (N/A)  Patient Location: Mother/Baby  Anesthesia Type:Spinal  Level of Consciousness: awake  Airway and Oxygen Therapy: Patient Spontanous Breathing  Post-op Pain: none  Post-op Assessment: Patient's Cardiovascular Status Stable, Respiratory Function Stable, Patent Airway, No signs of Nausea or vomiting, Adequate PO intake, Pain level controlled, No headache, No backache, No residual numbness and No residual motor weakness  Post-op Vital Signs: Reviewed and stable  Complications: No apparent anesthesia complications

## 2012-07-27 NOTE — Progress Notes (Signed)
UR completed 

## 2012-07-28 MED ORDER — RHO D IMMUNE GLOBULIN 1500 UNIT/2ML IJ SOLN
300.0000 ug | Freq: Once | INTRAMUSCULAR | Status: AC
  Administered 2012-07-28: 300 ug via INTRAMUSCULAR
  Filled 2012-07-28: qty 2

## 2012-07-28 NOTE — Progress Notes (Signed)
Patient ID: [name], female   DOB: Mar 19, 1985, 27 y.o.   MRN: [id number] Postop day 2 Vital signs normal Abdomen soft Legs negative Doing well

## 2012-07-29 LAB — RH IG WORKUP (INCLUDES ABO/RH)

## 2012-07-29 NOTE — Discharge Summary (Signed)
Obstetric Discharge Summary Reason for Admission: cesarean section Prenatal Procedures: none Intrapartum Procedures: cesarean: low cervical, transverse Postpartum Procedures: none Complications-Operative and Postpartum: none Hemoglobin  Date Value Range Status  07/27/2012 8.1* 12.0 - 15.0 g/dL Final     DELTA CHECK NOTED     REPEATED TO VERIFY     HCT  Date Value Range Status  07/27/2012 23.8* 36.0 - 46.0 % Final    Physical Exam:  General: alert Lochia: appropriate Uterine Fundus: firm Incision: healing well DVT Evaluation: No evidence of DVT seen on physical exam.  Discharge Diagnoses: Term Pregnancy-delivered  Discharge Information: Date: 07/29/2012 Activity: pelvic rest Diet: routine Medications: Percocet Condition: stable Instructions: refer to practice specific booklet Discharge to: home Follow-up Information   Follow up with MARSHALL,BERNARD A, MD. Schedule an appointment as soon as possible for a visit in 6 weeks.   Contact information:   [address] [place] [address] [phone]       Newborn Data: Live born female  Birth Weight: 8 lb 4.4 oz (3754 g) APGAR: 3, 8  Home with mother.  MARSHALL,BERNARD A 07/29/2012, 6:28 AM

## 2012-08-11 ENCOUNTER — Emergency Department (HOSPITAL_COMMUNITY)
Admission: EM | Admit: 2012-08-11 | Discharge: 2012-08-11 | Discharge status: Absent without leave | Payer: Medicaid Other | Source: Home / Self Care | Attending: Emergency Medicine | Admitting: Emergency Medicine

## 2013-01-18 LAB — OB RESULTS CONSOLE HIV ANTIBODY (ROUTINE TESTING): HIV: NONREACTIVE

## 2013-01-18 LAB — OB RESULTS CONSOLE GC/CHLAMYDIA
CHLAMYDIA, DNA PROBE: NEGATIVE
Gonorrhea: NEGATIVE

## 2013-01-18 LAB — OB RESULTS CONSOLE ABO/RH: RH TYPE: NEGATIVE

## 2013-01-18 LAB — OB RESULTS CONSOLE RPR: RPR: NONREACTIVE

## 2013-01-18 LAB — OB RESULTS CONSOLE HEPATITIS B SURFACE ANTIGEN: HEP B S AG: NEGATIVE

## 2013-04-25 ENCOUNTER — Other Ambulatory Visit: Payer: Self-pay | Admitting: Obstetrics

## 2013-04-25 DIAGNOSIS — N6320 Unspecified lump in the left breast, unspecified quadrant: Secondary | ICD-10-CM

## 2013-05-02 ENCOUNTER — Ambulatory Visit
Admission: RE | Admit: 2013-05-02 | Discharge: 2013-05-02 | Disposition: A | Discharge status: Home / Self Care | Payer: Medicaid Other | Source: Ambulatory Visit | Attending: Obstetrics | Admitting: Obstetrics

## 2013-05-02 DIAGNOSIS — N6320 Unspecified lump in the left breast, unspecified quadrant: Secondary | ICD-10-CM

## 2013-07-04 ENCOUNTER — Other Ambulatory Visit: Payer: Self-pay | Admitting: Obstetrics

## 2013-07-07 NOTE — H&P (Signed)
NAME:  [name], [name]                 ACCOUNT NO.:  [id number]  MEDICAL RECORD NO.:  [id number]  LOCATION:                                 FACILITY:  PHYSICIAN:  Kathreen Cosier, M.D.DATE OF BIRTH:  [birth year]  DATE OF ADMISSION:  07/17/2013 DATE OF DISCHARGE:                             HISTORY & PHYSICAL   HISTORY OF PRESENT ILLNESS:  The patient is a 28 year old, gravida 4, para 3-0-0-3, who has had 3 previous C-sections.  Her EDC is July 23, 2013, and she is now at term for repeat C-section and tubal ligation.  PAST MEDICAL HISTORY:  Negative.  PAST SURGICAL HISTORY:  C-section x3.  SOCIAL HISTORY:  Negative.  SYSTEM REVIEW:  Negative.  PHYSICAL EXAMINATION:  GENERAL:  Well-developed female in no distress. HEENT:  Negative. LUNGS:  Clear to P and A. HEART:  Regular rhythm.  No murmurs, no gallops. BREASTS:  Negative. ABDOMEN:  Term. PELVIC:  Deferred. EXTREMITIES:  Negative.          ______________________________ Kathreen Cosier, M.D.     BAM/MEDQ  D:  07/05/2013  T:  07/05/2013  Job:  [id number]

## 2013-07-14 ENCOUNTER — Other Ambulatory Visit (HOSPITAL_COMMUNITY): Payer: Medicaid Other

## 2013-07-14 ENCOUNTER — Encounter (HOSPITAL_COMMUNITY): Payer: Self-pay

## 2013-07-14 ENCOUNTER — Encounter (HOSPITAL_COMMUNITY)
Admission: RE | Admit: 2013-07-14 | Discharge: 2013-07-14 | Disposition: A | Discharge status: Home / Self Care | Payer: Medicaid Other | Source: Ambulatory Visit | Attending: Obstetrics | Admitting: Obstetrics

## 2013-07-14 LAB — CBC
HEMATOCRIT: 32.9 % — AB (ref 36.0–46.0)
HEMOGLOBIN: 10.7 g/dL — AB (ref 12.0–15.0)
MCH: 30.8 pg (ref 26.0–34.0)
MCHC: 32.5 g/dL (ref 30.0–36.0)
MCV: 94.8 fL (ref 78.0–100.0)
Platelets: 157 10*3/uL (ref 150–400)
RBC: 3.47 MIL/uL — AB (ref 3.87–5.11)
RDW: 13.9 % (ref 11.5–15.5)
WBC: 10.7 10*3/uL — AB (ref 4.0–10.5)

## 2013-07-14 LAB — TYPE AND SCREEN
ABO/RH(D): O NEG
Antibody Screen: NEGATIVE

## 2013-07-14 LAB — RPR

## 2013-07-14 NOTE — Patient Instructions (Signed)
Your procedure is scheduled on:07/17/13  Enter through the Main Entrance at : 6am Pick up desk phone and dial [phone] and inform us of your arrival.  Please call [phone] if you have any problems the morning of surgery.  Remember: Do not eat food or drink liquids, including water, after midnight:Sunda   You may brush your teeth the morning of surgery.   DO NOT wear jewelry, eye make-up, lipstick,body lotion, or dark fingernail polish.  (Polished toes are ok) You may wear deodorant.  If you are to be admitted after surgery, leave suitcase in car until your room has been assigned. Patients discharged on the day of surgery will not be allowed to drive home. Wear loose fitting, comfortable clothes for your ride home.

## 2013-07-16 NOTE — Anesthesia Preprocedure Evaluation (Signed)
Anesthesia Evaluation  Patient identified by MRN, date of birth, ID band Patient awake    Reviewed: Allergy & Precautions, H&P , NPO status , Patient's Chart, lab work & pertinent test results  Airway Mallampati: III TM Distance: >3 FB Neck ROM: Full    Dental no notable dental hx. (+) Teeth Intact   Pulmonary asthma , Current Smoker,  breath sounds clear to auscultation  Pulmonary exam normal       Cardiovascular negative cardio ROS  Rhythm:Regular Rate:Normal     Neuro/Psych  Headaches, negative psych ROS   GI/Hepatic negative GI ROS, Neg liver ROS, Hyperemesis   Endo/Other  negative endocrine ROS  Renal/GU negative Renal ROS     Musculoskeletal negative musculoskeletal ROS (+)   Abdominal (+) + obese,   Peds  Hematology negative hematology ROS (+)   Anesthesia Other Findings   Reproductive/Obstetrics Previous C/Section                           Anesthesia Physical  Anesthesia Plan  ASA: II  Anesthesia Plan: Spinal   Post-op Pain Management:    Induction:   Airway Management Planned: Natural Airway  Additional Equipment:   Intra-op Plan:   Post-operative Plan:   Informed Consent: I have reviewed the patients History and Physical, chart, labs and discussed the procedure including the risks, benefits and alternatives for the proposed anesthesia with the patient or authorized representative who has indicated his/her understanding and acceptance.     Plan Discussed with: CRNA, Surgeon and Anesthesiologist  Anesthesia Plan Comments:         Anesthesia Quick Evaluation

## 2013-07-17 ENCOUNTER — Encounter (HOSPITAL_COMMUNITY)
Admission: RE | Disposition: A | Discharge status: Home / Self Care | Payer: Self-pay | Source: Ambulatory Visit | Attending: Obstetrics

## 2013-07-17 ENCOUNTER — Inpatient Hospital Stay (HOSPITAL_COMMUNITY)
Admission: RE | Admit: 2013-07-17 | Discharge: 2013-07-19 | DRG: 766 | Disposition: A | Discharge status: Home / Self Care | Payer: Medicaid Other | Source: Ambulatory Visit | Attending: Obstetrics | Admitting: Obstetrics

## 2013-07-17 ENCOUNTER — Encounter (HOSPITAL_COMMUNITY): Payer: Self-pay | Admitting: *Deleted

## 2013-07-17 ENCOUNTER — Encounter (HOSPITAL_COMMUNITY): Payer: Medicaid Other | Admitting: Anesthesiology

## 2013-07-17 ENCOUNTER — Inpatient Hospital Stay (HOSPITAL_COMMUNITY): Payer: Medicaid Other | Admitting: Anesthesiology

## 2013-07-17 DIAGNOSIS — Z98891 History of uterine scar from previous surgery: Secondary | ICD-10-CM

## 2013-07-17 DIAGNOSIS — O34219 Maternal care for unspecified type scar from previous cesarean delivery: Principal | ICD-10-CM | POA: Diagnosis present

## 2013-07-17 DIAGNOSIS — Z302 Encounter for sterilization: Secondary | ICD-10-CM

## 2013-07-17 DIAGNOSIS — O0940 Supervision of pregnancy with grand multiparity, unspecified trimester: Secondary | ICD-10-CM

## 2013-07-17 DIAGNOSIS — O99334 Smoking (tobacco) complicating childbirth: Secondary | ICD-10-CM | POA: Diagnosis present

## 2013-07-17 SURGERY — Surgical Case
Anesthesia: Spinal | Site: Abdomen | Laterality: Bilateral

## 2013-07-17 MED ORDER — FENTANYL CITRATE 0.05 MG/ML IJ SOLN
INTRAMUSCULAR | Status: DC | PRN
  Administered 2013-07-17 (×3): 50 ug via INTRAVENOUS

## 2013-07-17 MED ORDER — HYDROMORPHONE HCL PF 1 MG/ML IJ SOLN
0.5000 mg | INTRAMUSCULAR | Status: DC | PRN
  Administered 2013-07-17 (×2): 0.5 mg via INTRAVENOUS

## 2013-07-17 MED ORDER — LACTATED RINGERS IV SOLN
INTRAVENOUS | Status: DC | PRN
  Administered 2013-07-17: 08:00:00 via INTRAVENOUS

## 2013-07-17 MED ORDER — OXYTOCIN 40 UNITS IN LACTATED RINGERS INFUSION - SIMPLE MED: 62.5000 mL/h | INTRAVENOUS | Status: AC

## 2013-07-17 MED ORDER — OXYTOCIN 10 UNIT/ML IJ SOLN
INTRAMUSCULAR | Status: AC
  Filled 2013-07-17: qty 4

## 2013-07-17 MED ORDER — MEPERIDINE HCL 25 MG/ML IJ SOLN: 6.2500 mg | INTRAMUSCULAR | Status: DC | PRN

## 2013-07-17 MED ORDER — ONDANSETRON HCL 4 MG/2ML IJ SOLN: 4.0000 mg | INTRAMUSCULAR | Status: DC | PRN

## 2013-07-17 MED ORDER — NALBUPHINE HCL 10 MG/ML IJ SOLN
INTRAMUSCULAR | Status: AC
  Filled 2013-07-17: qty 1

## 2013-07-17 MED ORDER — NALBUPHINE HCL 10 MG/ML IJ SOLN: 5.0000 mg | INTRAMUSCULAR | Status: DC | PRN

## 2013-07-17 MED ORDER — NALOXONE HCL 0.4 MG/ML IJ SOLN: 0.4000 mg | INTRAMUSCULAR | Status: DC | PRN

## 2013-07-17 MED ORDER — FENTANYL CITRATE 0.05 MG/ML IJ SOLN
25.0000 ug | INTRAMUSCULAR | Status: DC | PRN
  Administered 2013-07-17 (×2): 50 ug via INTRAVENOUS

## 2013-07-17 MED ORDER — CEFAZOLIN SODIUM-DEXTROSE 2-3 GM-% IV SOLR: 2.0000 g | Freq: Once | INTRAVENOUS | Status: DC

## 2013-07-17 MED ORDER — ZOLPIDEM TARTRATE 5 MG PO TABS: 5.0000 mg | ORAL_TABLET | Freq: Every evening | ORAL | Status: DC | PRN

## 2013-07-17 MED ORDER — METOCLOPRAMIDE HCL 5 MG/ML IJ SOLN
10.0000 mg | Freq: Three times a day (TID) | INTRAMUSCULAR | Status: DC | PRN
  Administered 2013-07-17: 10 mg via INTRAVENOUS

## 2013-07-17 MED ORDER — BUPIVACAINE IN DEXTROSE 0.75-8.25 % IT SOLN
INTRATHECAL | Status: DC | PRN
  Administered 2013-07-17: 1.4 mL via INTRATHECAL

## 2013-07-17 MED ORDER — TETANUS-DIPHTH-ACELL PERTUSSIS 5-2.5-18.5 LF-MCG/0.5 IM SUSP: 0.5000 mL | Freq: Once | INTRAMUSCULAR | Status: DC

## 2013-07-17 MED ORDER — HYDROMORPHONE 0.3 MG/ML IV SOLN
INTRAVENOUS | Status: DC
  Administered 2013-07-17: 11:00:00 via INTRAVENOUS
  Administered 2013-07-17: 1.2 mg via INTRAVENOUS
  Filled 2013-07-17: qty 25

## 2013-07-17 MED ORDER — CLINDAMYCIN PHOSPHATE 900 MG/50ML IV SOLN
900.0000 mg | Freq: Once | INTRAVENOUS | Status: AC
  Administered 2013-07-17: 900 mg via INTRAVENOUS
  Filled 2013-07-17: qty 50

## 2013-07-17 MED ORDER — ONDANSETRON HCL 4 MG/2ML IJ SOLN: 4.0000 mg | Freq: Four times a day (QID) | INTRAMUSCULAR | Status: DC | PRN

## 2013-07-17 MED ORDER — FENTANYL CITRATE 0.05 MG/ML IJ SOLN
INTRAMUSCULAR | Status: AC
  Filled 2013-07-17: qty 5

## 2013-07-17 MED ORDER — DIPHENHYDRAMINE HCL 50 MG/ML IJ SOLN: 12.5000 mg | INTRAMUSCULAR | Status: DC | PRN

## 2013-07-17 MED ORDER — NALOXONE HCL 1 MG/ML IJ SOLN
1.0000 ug/kg/h | INTRAVENOUS | Status: DC | PRN
  Filled 2013-07-17: qty 2

## 2013-07-17 MED ORDER — WITCH HAZEL-GLYCERIN EX PADS
1.0000 "application " | MEDICATED_PAD | CUTANEOUS | Status: DC | PRN
  Administered 2013-07-19: 1 via TOPICAL

## 2013-07-17 MED ORDER — HYDROMORPHONE HCL PF 1 MG/ML IJ SOLN
INTRAMUSCULAR | Status: AC
  Administered 2013-07-17: 0.5 mg via INTRAVENOUS
  Filled 2013-07-17: qty 1

## 2013-07-17 MED ORDER — MORPHINE SULFATE (PF) 0.5 MG/ML IJ SOLN
INTRAMUSCULAR | Status: DC | PRN
  Administered 2013-07-17: .15 mg via EPIDURAL

## 2013-07-17 MED ORDER — PROPOFOL 10 MG/ML IV EMUL
INTRAVENOUS | Status: AC
  Filled 2013-07-17: qty 20

## 2013-07-17 MED ORDER — DIPHENHYDRAMINE HCL 50 MG/ML IJ SOLN: 12.5000 mg | Freq: Four times a day (QID) | INTRAMUSCULAR | Status: DC | PRN

## 2013-07-17 MED ORDER — SCOPOLAMINE 1 MG/3DAYS TD PT72
MEDICATED_PATCH | TRANSDERMAL | Status: AC
  Filled 2013-07-17: qty 1

## 2013-07-17 MED ORDER — SIMETHICONE 80 MG PO CHEW
80.0000 mg | CHEWABLE_TABLET | ORAL | Status: DC | PRN
  Administered 2013-07-18: 80 mg via ORAL
  Filled 2013-07-17: qty 1

## 2013-07-17 MED ORDER — IBUPROFEN 600 MG PO TABS
600.0000 mg | ORAL_TABLET | Freq: Four times a day (QID) | ORAL | Status: DC
  Administered 2013-07-17 – 2013-07-19 (×7): 600 mg via ORAL
  Filled 2013-07-17 (×7): qty 1

## 2013-07-17 MED ORDER — METOCLOPRAMIDE HCL 5 MG/ML IJ SOLN
INTRAMUSCULAR | Status: AC
  Filled 2013-07-17: qty 2

## 2013-07-17 MED ORDER — PRENATAL MULTIVITAMIN CH
1.0000 | ORAL_TABLET | Freq: Every day | ORAL | Status: DC
  Administered 2013-07-18: 1 via ORAL
  Filled 2013-07-17: qty 1

## 2013-07-17 MED ORDER — KETOROLAC TROMETHAMINE 30 MG/ML IJ SOLN: 30.0000 mg | Freq: Four times a day (QID) | INTRAMUSCULAR | Status: AC | PRN

## 2013-07-17 MED ORDER — MORPHINE SULFATE 0.5 MG/ML IJ SOLN
INTRAMUSCULAR | Status: AC
  Filled 2013-07-17: qty 10

## 2013-07-17 MED ORDER — MENTHOL 3 MG MT LOZG
1.0000 | LOZENGE | OROMUCOSAL | Status: DC | PRN
Start: 2013-07-17 — End: 2013-07-19

## 2013-07-17 MED ORDER — KETAMINE HCL 10 MG/ML IJ SOLN
INTRAMUSCULAR | Status: DC | PRN
  Administered 2013-07-17: 20 mg via INTRAVENOUS
  Administered 2013-07-17: 30 mg via INTRAVENOUS
  Administered 2013-07-17: 70 mg via INTRAVENOUS
  Administered 2013-07-17: 30 mg via INTRAVENOUS

## 2013-07-17 MED ORDER — LIDOCAINE HCL 1 % IJ SOLN
INTRAMUSCULAR | Status: AC
  Filled 2013-07-17: qty 20

## 2013-07-17 MED ORDER — ONDANSETRON HCL 4 MG/2ML IJ SOLN: 4.0000 mg | Freq: Three times a day (TID) | INTRAMUSCULAR | Status: DC | PRN

## 2013-07-17 MED ORDER — SODIUM CHLORIDE 0.9 % IJ SOLN: 3.0000 mL | INTRAMUSCULAR | Status: DC | PRN

## 2013-07-17 MED ORDER — KETOROLAC TROMETHAMINE 30 MG/ML IJ SOLN
INTRAMUSCULAR | Status: AC
  Filled 2013-07-17: qty 1

## 2013-07-17 MED ORDER — SODIUM CHLORIDE 0.9 % IJ SOLN: 9.0000 mL | INTRAMUSCULAR | Status: DC | PRN

## 2013-07-17 MED ORDER — SIMETHICONE 80 MG PO CHEW
80.0000 mg | CHEWABLE_TABLET | ORAL | Status: DC
  Administered 2013-07-17 – 2013-07-18 (×2): 80 mg via ORAL
  Filled 2013-07-17 (×2): qty 1

## 2013-07-17 MED ORDER — OXYTOCIN 10 UNIT/ML IJ SOLN
40.0000 [IU] | INTRAVENOUS | Status: DC | PRN
  Administered 2013-07-17: 40 [IU] via INTRAVENOUS

## 2013-07-17 MED ORDER — PHENYLEPHRINE 8 MG IN D5W 100 ML (0.08MG/ML) PREMIX OPTIME
INJECTION | INTRAVENOUS | Status: AC
  Filled 2013-07-17: qty 100

## 2013-07-17 MED ORDER — DIPHENHYDRAMINE HCL 12.5 MG/5ML PO ELIX
12.5000 mg | ORAL_SOLUTION | Freq: Four times a day (QID) | ORAL | Status: DC | PRN
  Filled 2013-07-17: qty 5

## 2013-07-17 MED ORDER — KETOROLAC TROMETHAMINE 30 MG/ML IJ SOLN
30.0000 mg | Freq: Four times a day (QID) | INTRAMUSCULAR | Status: AC | PRN
  Administered 2013-07-17: 30 mg via INTRAVENOUS

## 2013-07-17 MED ORDER — SIMETHICONE 80 MG PO CHEW
80.0000 mg | CHEWABLE_TABLET | Freq: Three times a day (TID) | ORAL | Status: DC
  Administered 2013-07-17 – 2013-07-19 (×5): 80 mg via ORAL
  Filled 2013-07-17 (×5): qty 1

## 2013-07-17 MED ORDER — DIPHENHYDRAMINE HCL 25 MG PO CAPS
25.0000 mg | ORAL_CAPSULE | ORAL | Status: DC | PRN
  Administered 2013-07-17 – 2013-07-18 (×2): 25 mg via ORAL
  Filled 2013-07-17 (×2): qty 1

## 2013-07-17 MED ORDER — OXYCODONE-ACETAMINOPHEN 5-325 MG PO TABS
1.0000 | ORAL_TABLET | ORAL | Status: DC | PRN
  Administered 2013-07-17 – 2013-07-19 (×6): 2 via ORAL
  Filled 2013-07-17 (×6): qty 2

## 2013-07-17 MED ORDER — ONDANSETRON HCL 4 MG/2ML IJ SOLN
INTRAMUSCULAR | Status: DC | PRN
  Administered 2013-07-17: 4 mg via INTRAVENOUS

## 2013-07-17 MED ORDER — FENTANYL CITRATE 0.05 MG/ML IJ SOLN
INTRAMUSCULAR | Status: AC
  Administered 2013-07-17: 50 ug via INTRAVENOUS
  Filled 2013-07-17: qty 2

## 2013-07-17 MED ORDER — FENTANYL CITRATE 0.05 MG/ML IJ SOLN
INTRAMUSCULAR | Status: DC | PRN
Start: 2013-07-17 — End: 2013-07-17
  Administered 2013-07-17: 25 ug via INTRATHECAL

## 2013-07-17 MED ORDER — ONDANSETRON HCL 4 MG PO TABS: 4.0000 mg | ORAL_TABLET | ORAL | Status: DC | PRN

## 2013-07-17 MED ORDER — DIPHENHYDRAMINE HCL 25 MG PO CAPS: 25.0000 mg | ORAL_CAPSULE | Freq: Four times a day (QID) | ORAL | Status: DC | PRN

## 2013-07-17 MED ORDER — PHENYLEPHRINE 40 MCG/ML (10ML) SYRINGE FOR IV PUSH (FOR BLOOD PRESSURE SUPPORT)
PREFILLED_SYRINGE | INTRAVENOUS | Status: AC
  Filled 2013-07-17: qty 5

## 2013-07-17 MED ORDER — ONDANSETRON HCL 4 MG/2ML IJ SOLN
INTRAMUSCULAR | Status: AC
  Filled 2013-07-17: qty 2

## 2013-07-17 MED ORDER — PHENYLEPHRINE 8 MG IN D5W 100 ML (0.08MG/ML) PREMIX OPTIME
INJECTION | INTRAVENOUS | Status: DC | PRN
  Administered 2013-07-17: 40 ug/min via INTRAVENOUS
  Administered 2013-07-17: 60 ug/min via INTRAVENOUS

## 2013-07-17 MED ORDER — ROCURONIUM BROMIDE 100 MG/10ML IV SOLN
INTRAVENOUS | Status: AC
  Filled 2013-07-17: qty 1

## 2013-07-17 MED ORDER — DIPHENHYDRAMINE HCL 50 MG/ML IJ SOLN: 25.0000 mg | INTRAMUSCULAR | Status: DC | PRN

## 2013-07-17 MED ORDER — SENNOSIDES-DOCUSATE SODIUM 8.6-50 MG PO TABS
2.0000 | ORAL_TABLET | ORAL | Status: DC
  Administered 2013-07-17 – 2013-07-18 (×2): 2 via ORAL
  Filled 2013-07-17 (×2): qty 2

## 2013-07-17 MED ORDER — NALBUPHINE HCL 10 MG/ML IJ SOLN
5.0000 mg | INTRAMUSCULAR | Status: DC | PRN
  Administered 2013-07-17: 10 mg via INTRAVENOUS

## 2013-07-17 MED ORDER — LANOLIN HYDROUS EX OINT
1.0000 | TOPICAL_OINTMENT | CUTANEOUS | Status: DC | PRN
Start: 2013-07-17 — End: 2013-07-19

## 2013-07-17 MED ORDER — LACTATED RINGERS IV SOLN
INTRAVENOUS | Status: DC
  Administered 2013-07-17: 13:00:00 via INTRAVENOUS

## 2013-07-17 MED ORDER — LIDOCAINE HCL 1 % IJ SOLN
INTRAMUSCULAR | Status: DC | PRN
  Administered 2013-07-17: 10 mL

## 2013-07-17 MED ORDER — DIBUCAINE 1 % RE OINT
1.0000 "application " | TOPICAL_OINTMENT | RECTAL | Status: DC | PRN
  Administered 2013-07-19: 1 via RECTAL
  Filled 2013-07-17: qty 28

## 2013-07-17 MED ORDER — SCOPOLAMINE 1 MG/3DAYS TD PT72
1.0000 | MEDICATED_PATCH | Freq: Once | TRANSDERMAL | Status: DC
  Administered 2013-07-17: 1.5 mg via TRANSDERMAL

## 2013-07-17 MED ORDER — LACTATED RINGERS IV SOLN
INTRAVENOUS | Status: DC
  Administered 2013-07-17: 100 mL/h via INTRAVENOUS
  Administered 2013-07-17 (×2): via INTRAVENOUS

## 2013-07-17 MED ORDER — FENTANYL CITRATE 0.05 MG/ML IJ SOLN
INTRAMUSCULAR | Status: AC
  Filled 2013-07-17: qty 2

## 2013-07-17 MED ORDER — KETAMINE HCL 10 MG/ML IJ SOLN
INTRAMUSCULAR | Status: AC
  Filled 2013-07-17: qty 1

## 2013-07-17 SURGICAL SUPPLY — 37 items
ADH SKN CLS APL DERMABOND .7 (GAUZE/BANDAGES/DRESSINGS) ×1
CLAMP CORD UMBIL (MISCELLANEOUS) IMPLANT
CLOTH BEACON ORANGE TIMEOUT ST (SAFETY) ×3 IMPLANT
CONTAINER PREFILL 10% NBF 15ML (MISCELLANEOUS) ×6 IMPLANT
DERMABOND ADVANCED (GAUZE/BANDAGES/DRESSINGS) ×2
DERMABOND ADVANCED .7 DNX12 (GAUZE/BANDAGES/DRESSINGS) ×1 IMPLANT
DRAPE LG THREE QUARTER DISP (DRAPES) IMPLANT
DRESSING DISP NPWT PICO 4X12 (MISCELLANEOUS) ×2 IMPLANT
DRSG OPSITE POSTOP 4X10 (GAUZE/BANDAGES/DRESSINGS) ×3 IMPLANT
DURAPREP 26ML APPLICATOR (WOUND CARE) ×3 IMPLANT
ELECT REM PT RETURN 9FT ADLT (ELECTROSURGICAL) ×3
ELECTRODE REM PT RTRN 9FT ADLT (ELECTROSURGICAL) ×1 IMPLANT
EXTRACTOR VACUUM M CUP 4 TUBE (SUCTIONS) ×1 IMPLANT
EXTRACTOR VACUUM M CUP 4' TUBE (SUCTIONS) ×1
GLOVE BIO SURGEON STRL SZ8.5 (GLOVE) ×3 IMPLANT
GOWN STRL REUS W/TWL 2XL LVL3 (GOWN DISPOSABLE) ×3 IMPLANT
GOWN STRL REUS W/TWL LRG LVL3 (GOWN DISPOSABLE) ×3 IMPLANT
KIT ABG SYR 3ML LUER SLIP (SYRINGE) IMPLANT
NDL HYPO 25X5/8 SAFETYGLIDE (NEEDLE) IMPLANT
NEEDLE HYPO 25X5/8 SAFETYGLIDE (NEEDLE) IMPLANT
NS IRRIG 1000ML POUR BTL (IV SOLUTION) ×3 IMPLANT
PACK C SECTION WH (CUSTOM PROCEDURE TRAY) ×3 IMPLANT
PAD OB MATERNITY 4.3X12.25 (PERSONAL CARE ITEMS) ×3 IMPLANT
SUT CHROMIC 0 CT 802H (SUTURE) ×3 IMPLANT
SUT CHROMIC 1 CTX 36 (SUTURE) ×6 IMPLANT
SUT CHROMIC 1MO 4 18 CR8 (SUTURE) ×6 IMPLANT
SUT CHROMIC 2 0 SH (SUTURE) ×3 IMPLANT
SUT GUT PLAIN 0 CT-3 TAN 27 (SUTURE) ×3 IMPLANT
SUT MON AB 4-0 PS1 27 (SUTURE) ×3 IMPLANT
SUT PDS AB 0 CTX 36 PDP370T (SUTURE) ×4 IMPLANT
SUT VIC AB 0 CT1 18XCR BRD8 (SUTURE) IMPLANT
SUT VIC AB 0 CT1 8-18 (SUTURE)
SUT VIC AB 0 CTX 36 (SUTURE) ×6
SUT VIC AB 0 CTX36XBRD ANBCTRL (SUTURE) ×2 IMPLANT
TOWEL OR 17X24 6PK STRL BLUE (TOWEL DISPOSABLE) ×3 IMPLANT
TRAY FOLEY CATH 14FR (SET/KITS/TRAYS/PACK) ×3 IMPLANT
WATER STERILE IRR 1000ML POUR (IV SOLUTION) ×3 IMPLANT

## 2013-07-17 NOTE — Transfer of Care (Signed)
Immediate Anesthesia Transfer of Care Note  Patient: [name]  Procedure(s) Performed: Procedure(s): REPEAT CESAREAN SECTION WITH BILATERAL TUBAL LIGATION (Bilateral)  Patient Location: PACU  Anesthesia Type:Spinal  Level of Consciousness: awake  Airway & Oxygen Therapy: Patient Spontanous Breathing  Post-op Assessment: Report given to PACU RN and Post -op Vital signs reviewed and stable  Post vital signs: stable  Complications: No apparent anesthesia complications

## 2013-07-17 NOTE — Anesthesia Postprocedure Evaluation (Signed)
  Anesthesia Post-op Note  Patient: [name]  Procedure(s) Performed: Procedure(s): REPEAT CESAREAN SECTION WITH BILATERAL TUBAL LIGATION (Bilateral)  Patient Location: PACU  Anesthesia Type:Spinal  Level of Consciousness: awake, alert  and oriented  Airway and Oxygen Therapy: Patient Spontanous Breathing  Post-op Pain: moderate  Post-op Assessment: Post-op Vital signs reviewed, Patient's Cardiovascular Status Stable, Respiratory Function Stable, Patent Airway, No signs of Nausea or vomiting, Pain level controlled, No headache, No backache, No residual numbness and No residual motor weakness  Post-op Vital Signs: Reviewed and stable  Last Vitals:  Filed Vitals:   07/17/13 1059  BP: 110/60  Pulse: 66  Temp: 36.6 C  Resp: 18    Complications: No apparent anesthesia complications

## 2013-07-17 NOTE — Op Note (Signed)
preop diagnosis previous cesarean section x3 at term multiparity desiring sterilization Postop diagnosis classical cesarean section and tubal ligation Anesthesia spinal Surgeon Dr. Francoise Ceo First assistant Dr. Coral Ceo Procedure after the spinal administered patient in the supine position abdomen prepped and draped  Bladder emptied  with a Foley catheter a midlin incision made carried down to the rectus fascia fascia cleaned and incised the length of the incision the peritoneum opened and the incision completed she had multiple a lower segment adhesions between the uterus and abdominal wall it was decided a classical incision performed this was done she was delivered of a female Apgar 89 placenta was anterior fundal removed manually and sent to labor and delivery uterine cavity clean with dry laps the uterine incision closed in 3 layers with interrupted sutures   one chromic hemostasis satisfactory the right tube grasped  midportion with a Babcock clamp 0 plain suture placed in the  Mesosalpinx  below the portion of tube within the clamp this was tied approximately 1 inch of tube was transected hemostasis satisfactory procedure done in a similar fashion on the other side lap and sponge counts correct blood loss 900 cc abdomen closed in layers peritoneum continuous with 2-0 chromic fascia continuous with 2-0 PDS and the skin closed with staples and a  Pico    drain placed patient tolerated the procedure well

## 2013-07-17 NOTE — Lactation Note (Signed)
This note was copied from the chart of [name]. Lactation Consultation Note  Patient Name: [name] [id number] Date: 07/17/2013 Reason for consult: Other (Comment) (charting for exclusion)   Maternal Data Formula Feeding for Exclusion: Yes Reason for exclusion: Mother's choice to formula feed on admision  Feeding Feeding Type: Formula Nipple Type: Regular  LATCH Score/Interventions                      Lactation Tools Discussed/Used     Consult Status Consult Status: Complete    Zara Chess 07/17/2013, 4:14 PM

## 2013-07-17 NOTE — H&P (Signed)
No change in the patient's history since the region of dictation she was reminded of a massive amount of adhesions and and abdominal cavity in in fact that we may not be significant tubes done

## 2013-07-17 NOTE — Anesthesia Procedure Notes (Signed)
Spinal  Patient location during procedure: OR Start time: 07/17/2013 7:26 AM Staffing Anesthesiologist: Nadiyah Zeis A. Performed by: anesthesiologist  Preanesthetic Checklist Completed: patient identified, site marked, surgical consent, pre-op evaluation, timeout performed, IV checked, risks and benefits discussed and monitors and equipment checked Spinal Block Patient position: sitting Prep: site prepped and draped and DuraPrep Patient monitoring: heart rate, cardiac monitor, continuous pulse ox and blood pressure Approach: midline Location: L4-5 Injection technique: single-shot Needle Needle type: Sprotte  Needle gauge: 24 G Needle length: 9 cm Assessment Sensory level: T4 Additional Notes Patient tolerated procedure well. Adequate sensory level.

## 2013-07-17 NOTE — Consult Note (Signed)
[facility]  Delivery Note:  C-section       07/17/2013  7:47 AM  I was called to the operating room at the request of the patient's obstetrician (Dr. Marshall) for a repeat c-section.  PRENATAL HX:  27 y/o G4P3003 at [redacted] weeks gestation with history of 3 previous c-sections.  Her pregnancy has been uncomplicated.   INTRAPARTUM HX:   Repeat c-section with AROM at delivery  DELIVERY:  Infant was vigorous at delivery, requiring no resuscitation other than standard warming, drying and stimulation.  APGARs 8 and 9.  Exam within normal limits.  After 5 minutes, baby left with nurse to assist parents with skin-to-skin care.   _____________________ Electronically Signed By: Charlynn Salih, MD Neonatologist 

## 2013-07-18 ENCOUNTER — Encounter (HOSPITAL_COMMUNITY): Payer: Self-pay | Admitting: Obstetrics

## 2013-07-18 LAB — CBC
HCT: 29.8 % — ABNORMAL LOW (ref 36.0–46.0)
Hemoglobin: 9.7 g/dL — ABNORMAL LOW (ref 12.0–15.0)
MCH: 30.7 pg (ref 26.0–34.0)
MCHC: 32.6 g/dL (ref 30.0–36.0)
MCV: 94.3 fL (ref 78.0–100.0)
PLATELETS: 137 10*3/uL — AB (ref 150–400)
RBC: 3.16 MIL/uL — ABNORMAL LOW (ref 3.87–5.11)
RDW: 13.5 % (ref 11.5–15.5)
WBC: 13.5 10*3/uL — AB (ref 4.0–10.5)

## 2013-07-18 LAB — BIRTH TISSUE RECOVERY COLLECTION (PLACENTA DONATION)

## 2013-07-18 MED ORDER — RHO D IMMUNE GLOBULIN 1500 UNIT/2ML IJ SOSY
300.0000 ug | PREFILLED_SYRINGE | Freq: Once | INTRAMUSCULAR | Status: AC
  Administered 2013-07-18: 300 ug via INTRAVENOUS
  Filled 2013-07-18: qty 2

## 2013-07-18 MED ORDER — BISACODYL 10 MG RE SUPP
10.0000 mg | Freq: Once | RECTAL | Status: AC
  Administered 2013-07-18: 10 mg via RECTAL
  Filled 2013-07-18: qty 1

## 2013-07-18 NOTE — Progress Notes (Signed)
Patient ID: [name], female   DOB: 02-03-1986, 28 y.o.   MRN: [id number] Postop day 1 Vital signs normal Fundus firm Incision dry Neck negative doing well

## 2013-07-19 LAB — RH IG WORKUP (INCLUDES ABO/RH)
ABO/RH(D): O NEG
Antibody Screen: NEGATIVE
Fetal Screen: NEGATIVE
Gestational Age(Wks): 39
Unit division: 0

## 2013-07-19 MED ORDER — FLEET ENEMA 7-19 GM/118ML RE ENEM
1.0000 | ENEMA | Freq: Once | RECTAL | Status: AC
  Administered 2013-07-19: 1 via RECTAL

## 2013-07-19 NOTE — Discharge Instructions (Signed)
Discharge instructions   You can wash your hair  Shower  Eat what you want  Drink what you want  See me in 6 weeks  Your ankles are going to swell more in the next 2 weeks than when pregnant  No sex for 6 weeks   Merilynn Haydu A, MD 07/19/2013

## 2013-07-19 NOTE — Progress Notes (Signed)
Patient ID: [name], female   DOB: 1986-02-03, 28 y.o.   MRN:  Vital signs normal Patient wants to discharge Shows good bowel sounds

## 2013-07-19 NOTE — Discharge Summary (Signed)
Obstetric Discharge Summary Reason for Admission: cesarean section Prenatal Procedures: none Intrapartum Procedures: cesarean: low cervical, transverse Postpartum Procedures: none Complications-Operative and Postpartum: none Hemoglobin  Date Value Ref Range Status  07/18/2013 9.7* 12.0 - 15.0 g/dL Final     HCT  Date Value Ref Range Status  07/18/2013 29.8* 36.0 - 46.0 % Final    Physical Exam:  General: alert Lochia: appropriate Uterine Fundus: firm Incision: healing well DVT Evaluation: No evidence of DVT seen on physical exam.  Discharge Diagnoses: Term Pregnancy-delivered  Discharge Information: Date: 07/19/2013 Activity: pelvic rest Diet: routine Medications: Percocet Condition: stable Instructions: refer to practice specific booklet Discharge to: home Follow-up Information   Follow up with Kathreen Cosier, MD.   Specialty:  Obstetrics and Gynecology   Contact information:   [address] [place] [address] [phone]       Newborn Data: Live born female  Birth Weight: 7 lb 4.4 oz (3300 g) APGAR: 8, 9  Home with mother.  MARSHALL,BERNARD A 07/19/2013, 7:01 AM

## 2013-07-19 NOTE — Progress Notes (Signed)
Ur chart review completed.  

## 2013-12-11 ENCOUNTER — Encounter (HOSPITAL_COMMUNITY): Payer: Self-pay | Admitting: Obstetrics

## 2015-10-19 LAB — PROCEDURE REPORT - SCANNED: PAP SMEAR: NEGATIVE

## 2016-02-10 DEATH — deceased

## 2017-06-13 ENCOUNTER — Other Ambulatory Visit: Payer: Self-pay

## 2017-06-13 ENCOUNTER — Encounter (HOSPITAL_COMMUNITY): Payer: Self-pay | Admitting: Emergency Medicine

## 2017-06-13 ENCOUNTER — Emergency Department (HOSPITAL_COMMUNITY)
Admission: EM | Admit: 2017-06-13 | Discharge: 2017-06-13 | Disposition: A | Discharge status: Home / Self Care | Payer: Self-pay | Attending: Emergency Medicine | Admitting: Emergency Medicine

## 2017-06-13 DIAGNOSIS — R220 Localized swelling, mass and lump, head: Secondary | ICD-10-CM

## 2017-06-13 DIAGNOSIS — K0889 Other specified disorders of teeth and supporting structures: Secondary | ICD-10-CM

## 2017-06-13 DIAGNOSIS — J45909 Unspecified asthma, uncomplicated: Secondary | ICD-10-CM | POA: Insufficient documentation

## 2017-06-13 DIAGNOSIS — K047 Periapical abscess without sinus: Secondary | ICD-10-CM | POA: Insufficient documentation

## 2017-06-13 DIAGNOSIS — F17210 Nicotine dependence, cigarettes, uncomplicated: Secondary | ICD-10-CM | POA: Insufficient documentation

## 2017-06-13 MED ORDER — IBUPROFEN 800 MG PO TABS: 800.0000 mg | ORAL_TABLET | Freq: Three times a day (TID) | ORAL | 0 refills | Status: DC | PRN

## 2017-06-13 MED ORDER — CLINDAMYCIN HCL 300 MG PO CAPS: 300.0000 mg | ORAL_CAPSULE | Freq: Four times a day (QID) | ORAL | 0 refills | Status: DC

## 2017-06-13 MED ORDER — KETOROLAC TROMETHAMINE 60 MG/2ML IM SOLN
60.0000 mg | Freq: Once | INTRAMUSCULAR | Status: AC
  Administered 2017-06-13: 60 mg via INTRAMUSCULAR
  Filled 2017-06-13: qty 2

## 2017-06-13 MED ORDER — TRAMADOL HCL 50 MG PO TABS: 50.0000 mg | ORAL_TABLET | Freq: Four times a day (QID) | ORAL | 0 refills | Status: DC | PRN

## 2017-06-13 NOTE — Discharge Instructions (Addendum)
Follow-up with a dentist as soon as possible.  Rinse with warm water peroxide 3 times a day.

## 2017-06-13 NOTE — ED Triage Notes (Signed)
Pt. Stated, I was flossing my teeth and the floss got hung up and I pulled it, and left my left side of my gums s sore.

## 2017-06-13 NOTE — ED Provider Notes (Signed)
[facility] EMERGENCY DEPARTMENT Provider Note   CSN: [id number] Arrival date & time: 06/13/17  7829     History   Chief Complaint Chief Complaint  Patient presents with  . Oral Swelling    HPI [name] is a 32 y.o. female.  HPI Patient presents to the emergency department with upper left dentition and gum irritation.  The patient states that several days ago she was trying to floss her teeth but she felt like there is something stuck between her teeth and she states that she went to far up into the gumline and felt like she irritated it and now they are sore and swollen.  Patient states that nothing seems make the condition better or worse.  She states that her teeth do not necessarily hurt it is more the gums that are bothering her in that left upper dentition.  The area is mostly in the back of her upper dentition on the left.  Patient states she did not take any medications prior to arrival for symptoms.  Patient denies throat swelling, tongue swelling, facial swelling, weakness, headache, blurred vision, nausea, vomiting, or syncope. Past Medical History:  Diagnosis Date  . Abnormal Pap smear   . Asthma    inhaler use daily  . Hyperemesis complicating pregnancy, antepartum   . Migraines     Patient Active Problem List   Diagnosis Date Noted  . S/P cesarean section 07/17/2013    Past Surgical History:  Procedure Laterality Date  . CESAREAN SECTION    . CESAREAN SECTION N/A 07/26/2012   Procedure: REPEAT CESAREAN SECTION;  Surgeon: Kathreen Cosier, MD;  Location: [facility];  Service: Obstetrics;  Laterality: N/A;  . CESAREAN SECTION WITH BILATERAL TUBAL LIGATION Bilateral 07/17/2013   Procedure: REPEAT CESAREAN SECTION WITH BILATERAL TUBAL LIGATION;  Surgeon: Kathreen Cosier, MD;  Location: [facility];  Service: Obstetrics;  Laterality: Bilateral;     OB History    Gravida  4   Para  4   Term  4   Preterm      AB      Living  4     SAB      TAB      Ectopic      Multiple      Live Births  2            Home Medications    Prior to Admission medications   Not on File    Family History Family History  Problem Relation Age of Onset  . Hypertension Father   . Diabetes Father   . Cancer Maternal Grandmother 56       ovarian  . Cancer Paternal Grandmother        brain tumor    Social History Social History   Tobacco Use  . Smoking status: Current Every Day Smoker    Packs/day: 0.50    Years: 13.00    Pack years: 6.50    Types: Cigarettes  . Smokeless tobacco: Current User  Substance Use Topics  . Alcohol use: No  . Drug use: No     Allergies   Latex and Penicillins   Review of Systems Review of Systems All other systems negative except as documented in the HPI. All pertinent positives and negatives as reviewed in the HPI.  Physical Exam Updated Vital Signs BP 114/72 (BP Location: Right Arm)   Pulse 85   Temp 98.9 F (37.2 C) (Oral)   Resp 16  Ht  (1.499 m)   Wt 54.4 kg (120 lb)   LMP 06/08/2017   SpO2 100%   BMI 24.24 kg/m   Physical Exam  Constitutional: She is oriented to person, place, and time. She appears well-developed and well-nourished. No distress.  HENT:  Head: Normocephalic and atraumatic.  Mouth/Throat: No oral lesions. No trismus in the jaw. Dental caries present. No dental abscesses or uvula swelling.    Eyes: Pupils are equal, round, and reactive to light.  Pulmonary/Chest: Effort normal.  Neurological: She is alert and oriented to person, place, and time.  Skin: Skin is warm and dry.  Psychiatric: She has a normal mood and affect.  Nursing note and vitals reviewed.    ED Treatments / Results  Labs (all labs ordered are listed, but only abnormal results are displayed) Labs Reviewed - No data to display  EKG None  Radiology No results found.  Procedures Procedures (including critical care time)  Medications Ordered in ED Medications -  No data to display   Initial Impression / Assessment and Plan / ED Course  I have reviewed the triage vital signs and the nursing notes.  Pertinent labs & imaging results that were available during my care of the patient were reviewed by me and considered in my medical decision making (see chart for details).     We will have the patient follow-up with a dentist.  Have advised her to return here for any worsening in her condition.  Will place on antibiotics and advised to rinse with warm water peroxide 3 times a day.  Patient agrees the plan and all questions were answered.  Final Clinical Impressions(s) / ED Diagnoses   Final diagnoses:  None    ED Discharge Orders    None       Charlestine Night, PA-C 06/13/17 1225    Terrilee Files, MD 06/14/17 [phone]

## 2017-08-31 ENCOUNTER — Encounter (HOSPITAL_COMMUNITY): Payer: Self-pay | Admitting: Emergency Medicine

## 2017-08-31 ENCOUNTER — Emergency Department (HOSPITAL_COMMUNITY)
Admission: EM | Admit: 2017-08-31 | Discharge: 2017-08-31 | Disposition: A | Discharge status: Home / Self Care | Payer: Self-pay | Attending: Emergency Medicine | Admitting: Emergency Medicine

## 2017-08-31 ENCOUNTER — Other Ambulatory Visit: Payer: Self-pay

## 2017-08-31 DIAGNOSIS — H539 Unspecified visual disturbance: Secondary | ICD-10-CM | POA: Insufficient documentation

## 2017-08-31 DIAGNOSIS — J45909 Unspecified asthma, uncomplicated: Secondary | ICD-10-CM | POA: Insufficient documentation

## 2017-08-31 DIAGNOSIS — G43009 Migraine without aura, not intractable, without status migrainosus: Secondary | ICD-10-CM | POA: Insufficient documentation

## 2017-08-31 DIAGNOSIS — F17210 Nicotine dependence, cigarettes, uncomplicated: Secondary | ICD-10-CM | POA: Insufficient documentation

## 2017-08-31 MED ORDER — KETOROLAC TROMETHAMINE 15 MG/ML IJ SOLN
15.0000 mg | Freq: Once | INTRAMUSCULAR | Status: AC
  Administered 2017-08-31: 15 mg via INTRAVENOUS
  Filled 2017-08-31: qty 1

## 2017-08-31 MED ORDER — DIPHENHYDRAMINE HCL 50 MG/ML IJ SOLN
25.0000 mg | Freq: Once | INTRAMUSCULAR | Status: AC
Start: 2017-08-31 — End: 2017-08-31
  Administered 2017-08-31: 25 mg via INTRAVENOUS
  Filled 2017-08-31: qty 1

## 2017-08-31 MED ORDER — PROCHLORPERAZINE MALEATE 10 MG PO TABS: 10.0000 mg | ORAL_TABLET | Freq: Three times a day (TID) | ORAL | 0 refills | Status: DC | PRN

## 2017-08-31 MED ORDER — PROCHLORPERAZINE EDISYLATE 10 MG/2ML IJ SOLN
10.0000 mg | Freq: Once | INTRAMUSCULAR | Status: AC
  Administered 2017-08-31: 10 mg via INTRAVENOUS
  Filled 2017-08-31: qty 2

## 2017-08-31 MED ORDER — SODIUM CHLORIDE 0.9 % IV BOLUS
1000.0000 mL | Freq: Once | INTRAVENOUS | Status: AC
  Administered 2017-08-31: 1000 mL via INTRAVENOUS

## 2017-08-31 NOTE — ED Triage Notes (Signed)
Pt. Stated, This has been going on since I was 12.

## 2017-08-31 NOTE — ED Triage Notes (Signed)
ptl stated, I was driving from work and started having a headache and took Ibuprofen , my rt. Eye , I only can see out of left eye. Its black except spots.

## 2017-08-31 NOTE — ED Provider Notes (Signed)
[facility]-EMERGENCY DEPT Provider Note MRN:  [id number]  Arrival date & time: 08/31/17     Chief Complaint   Eye Problem and Headache   History of Present Illness   [name] is a 32 y.o. year-old female with a history of migraines presenting to the ED with chief complaint of headache and vision changes.  Patient has been experiencing a sharp headache behind the right eye for the past 2 days.  The headache began gradually and has become progressively worse over the past 2 days.  Associated with malaise, fatigue, nausea, p.o. intolerance.  Over the course of the past 6 to 8 hours, the patient has slowly experienced decreased visual acuity in the right eye.  She states that these headaches and symptoms have been occurring on and off since she was 32 years old.  She has been diagnosed with migraines, but does not have any preventative medicines at home.  She states that this episode over the past 2 days is very typical of her typical migraines.  Has tried ibuprofen at home without relief.  Denies any recent fevers or coughs, no chest pain or shortness of breath, no abdominal pain, no numbness or weakness in her arms or legs.  Review of Systems  Positive for headache, malaise, visual changes.  All other systems reviewed and negative.  Patient's Health History    Past Medical History:  Diagnosis Date  . Abnormal Pap smear   . Asthma    inhaler use daily  . Hyperemesis complicating pregnancy, antepartum   . Migraines     Past Surgical History:  Procedure Laterality Date  . CESAREAN SECTION    . CESAREAN SECTION N/A 07/26/2012   Procedure: REPEAT CESAREAN SECTION;  Surgeon: Kathreen Cosier, MD;  Location: [facility];  Service: Obstetrics;  Laterality: N/A;  . CESAREAN SECTION WITH BILATERAL TUBAL LIGATION Bilateral 07/17/2013   Procedure: REPEAT CESAREAN SECTION WITH BILATERAL TUBAL LIGATION;  Surgeon: Kathreen Cosier, MD;  Location: [facility];  Service: Obstetrics;   Laterality: Bilateral;    Family History  Problem Relation Age of Onset  . Hypertension Father   . Diabetes Father   . Cancer Maternal Grandmother 56       ovarian  . Cancer Paternal Grandmother        brain tumor    Social History   Socioeconomic History  . Marital status: Single    Spouse name: Not on file  . Number of children: Not on file  . Years of education: Not on file  . Highest education level: Not on file  Occupational History  . Not on file  Social Needs  . Financial resource strain: Not on file  . Food insecurity:    Worry: Not on file    Inability: Not on file  . Transportation needs:    Medical: Not on file    Non-medical: Not on file  Tobacco Use  . Smoking status: Current Every Day Smoker    Packs/day: 0.50    Years: 13.00    Pack years: 6.50    Types: Cigarettes  . Smokeless tobacco: Current User  Substance and Sexual Activity  . Alcohol use: No  . Drug use: No  . Sexual activity: Yes  Lifestyle  . Physical activity:    Days per week: Not on file    Minutes per session: Not on file  . Stress: Not on file  Relationships  . Social connections:    Talks on phone:  Not on file    Gets together: Not on file    Attends religious service: Not on file    Active member of club or organization: Not on file    Attends meetings of clubs or organizations: Not on file    Relationship status: Not on file  . Intimate partner violence:    Fear of current or ex partner: Not on file    Emotionally abused: Not on file    Physically abused: Not on file    Forced sexual activity: Not on file  Other Topics Concern  . Not on file  Social History Narrative  . Not on file     Physical Exam  Vital Signs and Nursing Notes reviewed Vitals:   08/31/17 0843 08/31/17 1001  BP: 119/81 123/88  Pulse: 61 62  Resp: 18 17  Temp:    SpO2: 100% 98%    CONSTITUTIONAL: Well-appearing, NAD NEURO:  Alert and oriented x 3, no focal deficits EYES:  eyes equal and  reactive, minimal decreased visual acuity of the right eye, normal extraocular movements ENT/NECK:  no LAD, no JVD CARDIO: Regular rate, well-perfused, normal S1 and S2 PULM:  CTAB no wheezing or rhonchi GI/GU:  normal bowel sounds, non-distended, non-tender MSK/SPINE:  No gross deformities, no edema SKIN:  no rash, atraumatic PSYCH:  Appropriate speech and behavior  Diagnostic and Interventional Summary    EKG Interpretation  Date/Time:    Ventricular Rate:    PR Interval:    QRS Duration:   QT Interval:    QTC Calculation:   R Axis:     Text Interpretation:        Labs Reviewed - No data to display  No orders to display    Medications  sodium chloride 0.9 % bolus 1,000 mL (0 mLs Intravenous Stopped 08/31/17 1001)  ketorolac (TORADOL) 15 MG/ML injection 15 mg (15 mg Intravenous Given 08/31/17 0845)  diphenhydrAMINE (BENADRYL) injection 25 mg (25 mg Intravenous Given 08/31/17 0845)  prochlorperazine (COMPAZINE) injection 10 mg (10 mg Intravenous Given 08/31/17 0844)     Procedures:  none Critical Care:  none  ED Course and Medical Decision Making  I have reviewed the triage vital signs and the nursing notes.  Pertinent labs & imaging results that were available during my care of the patient were reviewed by me and considered in my medical decision making (see below for details).  Clinical Course as of Sep 01 1527  Tue Aug 31, 2017  [id number] 32 year old female with a history of migraines presenting with chief complaint of gradual onset headache and visual changes in the right eye.  Patient explains that the symptoms are very similar to her prior episodes of what sounds like complex migraines, very typically experiencing visual changes during her weekly headaches.  Patient denies any fevers, no new neurological deficits that would suggest other etiology today.  Vital signs stable, well-appearing, no focal neurological deficits with the exception of very minimal right eye decreased  visual acuity.  No other evident pathology on exam to suggest retinal injury, trauma, stroke, ICH.  No fever, no meningismus.  Will provide migraine cocktail and reassess.   [MB]  [id number] Full resolution of headache and visual changes after migraine cocktail.  Patient is thankful for care and will follow up with a primary care provider to discuss preventative migraine medications.  Will prescribe p.o. Compazine.  After the discussed management above, the patient was determined to be safe for discharge.  The patient was in  agreement with this plan and all questions regarding their care were answered.  ED return precautions were discussed and the patient will return to the ED with any significant worsening of condition.Follow-up: PCPRestrictions: None   [MB]    Clinical Course User Index [MB] Sabas SousBero, Kalid Ghan M, MD     Elmer SowMichael M. Pilar PlateBero, MD [facility] [email or web address]  Final Clinical Impressions(s) / ED Diagnoses     ICD-10-CM   1. Migraine without aura and without status migrainosus, not intractable G43.009   2. Vision changes H53.9     ED Discharge Orders        Ordered    prochlorperazine (COMPAZINE) 10 MG tablet  Every 8 hours PRN     08/31/17 0941         Sabas SousBero, Eusebio Blazejewski M, MD 08/31/17 1529

## 2017-08-31 NOTE — ED Notes (Signed)
Patient given discharge instructions and verbalized understanding.  Patient stable to discharge at this time.  Patient is alert and oriented to baseline.  No distressed noted at this time.  All belongings taken with the patient at discharge.   

## 2017-08-31 NOTE — Discharge Instructions (Addendum)
[facility].  After careful evaluation, we did not find any emergent condition requiring admission or further testing in the hospital.  Your symptoms today seem to be due to a migraine headache.  Please follow-up with a primary care provider to discuss preventative medications.  Use the prescription provided as needed for headache.  Please return to the Emergency Department if you experience any worsening of your condition.  We encourage you to follow up with a primary care provider.  Thank you for allowing us to be a part of your care.

## 2018-03-24 ENCOUNTER — Emergency Department (HOSPITAL_COMMUNITY)
Admission: EM | Admit: 2018-03-24 | Discharge: 2018-03-24 | Disposition: A | Discharge status: Home / Self Care | Payer: BLUE CROSS/BLUE SHIELD | Attending: Emergency Medicine | Admitting: Emergency Medicine

## 2018-03-24 ENCOUNTER — Ambulatory Visit (HOSPITAL_COMMUNITY)
Admission: EM | Admit: 2018-03-24 | Discharge: 2018-03-24 | Discharge status: Against Medical Advice | Payer: BLUE CROSS/BLUE SHIELD | Source: Home / Self Care

## 2018-03-24 ENCOUNTER — Other Ambulatory Visit: Payer: Self-pay

## 2018-03-24 ENCOUNTER — Encounter (HOSPITAL_COMMUNITY): Payer: Self-pay | Admitting: *Deleted

## 2018-03-24 DIAGNOSIS — Z5321 Procedure and treatment not carried out due to patient leaving prior to being seen by health care provider: Secondary | ICD-10-CM | POA: Diagnosis not present

## 2018-03-24 DIAGNOSIS — R51 Headache: Secondary | ICD-10-CM | POA: Insufficient documentation

## 2018-03-24 NOTE — ED Notes (Signed)
Per Urgent [facility]. Patient is now being seen there.

## 2018-03-24 NOTE — ED Triage Notes (Signed)
Pt endorses waking up at 0330 with migraine and "I can't see out of the left portion of my left eye" Has hx of same. Took ibuprofen without relief. VSS.

## 2018-03-24 NOTE — ED Notes (Signed)
Pt decided to leave and go to urgent care. 

## 2018-03-24 NOTE — ED Triage Notes (Signed)
Pt reports "severe" headache, 10/10, onset this am w/left eye vision loss.   Sts she went to the ED earlier today but d/t wait time left w/o being seen.  Spoke w/Dr. Rica Mast who recommended for pt to got to the ED immediatly for further evaluation  Pt got upset and sts she doesn't feel like waiting down in the ED for another 2 hours and left out of here.

## 2018-12-16 ENCOUNTER — Other Ambulatory Visit: Payer: Self-pay

## 2018-12-16 ENCOUNTER — Encounter (HOSPITAL_COMMUNITY): Payer: Self-pay | Admitting: Emergency Medicine

## 2018-12-16 ENCOUNTER — Emergency Department (HOSPITAL_COMMUNITY)
Admission: EM | Admit: 2018-12-16 | Discharge: 2018-12-16 | Disposition: A | Discharge status: Home / Self Care | Payer: BLUE CROSS/BLUE SHIELD | Attending: Emergency Medicine | Admitting: Emergency Medicine

## 2018-12-16 DIAGNOSIS — R519 Headache, unspecified: Secondary | ICD-10-CM | POA: Insufficient documentation

## 2018-12-16 DIAGNOSIS — F17220 Nicotine dependence, chewing tobacco, uncomplicated: Secondary | ICD-10-CM | POA: Insufficient documentation

## 2018-12-16 DIAGNOSIS — J45909 Unspecified asthma, uncomplicated: Secondary | ICD-10-CM | POA: Insufficient documentation

## 2018-12-16 DIAGNOSIS — Z9189 Other specified personal risk factors, not elsewhere classified: Secondary | ICD-10-CM

## 2018-12-16 DIAGNOSIS — F17210 Nicotine dependence, cigarettes, uncomplicated: Secondary | ICD-10-CM | POA: Insufficient documentation

## 2018-12-16 DIAGNOSIS — Z20828 Contact with and (suspected) exposure to other viral communicable diseases: Secondary | ICD-10-CM | POA: Insufficient documentation

## 2018-12-16 LAB — URINALYSIS, ROUTINE W REFLEX MICROSCOPIC
Bilirubin Urine: NEGATIVE
Glucose, UA: NEGATIVE mg/dL
Hgb urine dipstick: NEGATIVE
Ketones, ur: NEGATIVE mg/dL
Leukocytes,Ua: NEGATIVE
Nitrite: NEGATIVE
Protein, ur: NEGATIVE mg/dL
Specific Gravity, Urine: 1.028 (ref 1.005–1.030)
pH: 6 (ref 5.0–8.0)

## 2018-12-16 LAB — COMPREHENSIVE METABOLIC PANEL
ALT: 16 U/L (ref 0–44)
AST: 22 U/L (ref 15–41)
Albumin: 3.8 g/dL (ref 3.5–5.0)
Alkaline Phosphatase: 47 U/L (ref 38–126)
Anion gap: 9 (ref 5–15)
BUN: 9 mg/dL (ref 6–20)
CO2: 23 mmol/L (ref 22–32)
Calcium: 8.6 mg/dL — ABNORMAL LOW (ref 8.9–10.3)
Chloride: 106 mmol/L (ref 98–111)
Creatinine, Ser: 0.66 mg/dL (ref 0.44–1.00)
GFR calc Af Amer: >60 mL/min (ref >60)
GFR calc non Af Amer: >60 mL/min (ref >60)
Glucose, Bld: 91 mg/dL (ref 70–99)
Potassium: 4 mmol/L (ref 3.5–5.1)
Sodium: 138 mmol/L (ref 135–145)
Total Bilirubin: 0.4 mg/dL (ref 0.3–1.2)
Total Protein: 6.6 g/dL (ref 6.5–8.1)

## 2018-12-16 LAB — CBC
HCT: 38.6 % (ref 36.0–46.0)
Hemoglobin: 12.3 g/dL (ref 12.0–15.0)
MCH: 31.8 pg (ref 26.0–34.0)
MCHC: 31.9 g/dL (ref 30.0–36.0)
MCV: 99.7 fL (ref 80.0–100.0)
Platelets: 269 10*3/uL (ref 150–400)
RBC: 3.87 MIL/uL (ref 3.87–5.11)
RDW: 13.9 % (ref 11.5–15.5)
WBC: 10.9 10*3/uL — ABNORMAL HIGH (ref 4.0–10.5)
nRBC: 0 % (ref 0.0–0.2)

## 2018-12-16 LAB — I-STAT BETA HCG BLOOD, ED (MC, WL, AP ONLY): I-stat hCG, quantitative: <5 m[IU]/mL (ref <5)

## 2018-12-16 LAB — LIPASE, BLOOD: Lipase: 32 U/L (ref 11–51)

## 2018-12-16 LAB — SARS CORONAVIRUS 2 (TAT 6-24 HRS): SARS Coronavirus 2: NEGATIVE

## 2018-12-16 MED ORDER — DIPHENHYDRAMINE HCL 50 MG/ML IJ SOLN
25.0000 mg | Freq: Once | INTRAMUSCULAR | Status: AC
  Administered 2018-12-16: 11:00:00 25 mg via INTRAVENOUS
  Filled 2018-12-16: qty 1

## 2018-12-16 MED ORDER — SODIUM CHLORIDE 0.9 % IV BOLUS
1000.0000 mL | Freq: Once | INTRAVENOUS | Status: AC
  Administered 2018-12-16: 11:00:00 1000 mL via INTRAVENOUS

## 2018-12-16 MED ORDER — SODIUM CHLORIDE 0.9% FLUSH: 3.0000 mL | Freq: Once | INTRAVENOUS | Status: DC

## 2018-12-16 MED ORDER — DEXAMETHASONE SODIUM PHOSPHATE 10 MG/ML IJ SOLN
10.0000 mg | Freq: Once | INTRAMUSCULAR | Status: AC
  Administered 2018-12-16: 11:00:00 10 mg via INTRAVENOUS
  Filled 2018-12-16: qty 1

## 2018-12-16 MED ORDER — METOCLOPRAMIDE HCL 5 MG/ML IJ SOLN
10.0000 mg | Freq: Once | INTRAMUSCULAR | Status: AC
  Administered 2018-12-16: 10 mg via INTRAVENOUS
  Filled 2018-12-16: qty 2

## 2018-12-16 NOTE — ED Triage Notes (Signed)
Pt c/o nausea/vomiting/mild abdominal pain, nasal congestion and a headache. Unsure of COVID exposure.

## 2018-12-16 NOTE — Discharge Instructions (Signed)
Today you were evaluated at the emergency department for a headache. ° °1. Medications: continue usual home medications °2. Treatment: rest, drink plenty of fluids, if headache persists take 800mmg ibuprofen with caffeine °3. Follow Up: Please followup with your primary doctor in 3 days and neurology within 1 week for discussion of your diagnoses and further evaluation after today's visit; if you do not have a primary care doctor use the resource guide provided to find one; Please return to the ER for double vision, speech difficulty, gait disturbance, persistent vomiting or other concerns. ° °Headache: ° °You are having a headache. No specific cause was found today for your headache. It may have been a migraine or other cause of headache. Stress, anxiety, fatigue, and depression are common triggers for headaches. Your headache today does not appear to be life-threatening or require hospitalization, but often the exact cause of headaches is not determined in the emergency department. Therefore, followup with your doctor is very important to find out what may have caused your headache, and whether or not you need any further diagnostic testing or treatment. Sometimes headaches can appear benign but then more serious symptoms can develop which should prompt an immediate reevaluation by your doctor or the emergency department. ° °Hydration: Have a goal of about a half liter of water every couple hours to stay well hydrated.  ° °Sleep: Please be sure to get plenty of sleep with a goal of 8 hours per night. Having a regular bed time and bedtime routine can help with this. ° °Screens: Reduce the amount of time you are in front of screens.  Take about a 5-10-minute break every hour or every couple hours to give your eyes rest.  Do not use screens in dark rooms.  Glasses with a blue light filter may also help reduce eye fatigue. ° °Stress: Take steps to reduce stress as much as possible.  ° °Seek immediate medical attention  if: ° °You develop possible problems with medications prescribed. °The medications don't resolve your headache, if it recurs, or if you have multiple episodes of vomiting or can't take fluids by mouth °You have a change from the usual headache. °If you developed a sudden severe headache or confusion, become poorly responsive or faint, developed a fever above 100.4 or problems breathing, have a change in speech, vision, swallowing or understanding, or developed new weakness, numbness, tingling, incoordination or have a seizure. ° ° ° °

## 2018-12-16 NOTE — ED Provider Notes (Signed)
[facility] EMERGENCY DEPARTMENT Provider Note   CSN: [id number] Arrival date & time: 12/16/18  0532     History   Chief Complaint Chief Complaint  Patient presents with  . Emesis    HPI [name] is a 33 y.o. female with past medical history significant for asthma, migraines presents to emergency department today with chief complaint of emesis x2 days.  Patient states yesterday she felt a migraine coming on.  It felt like her usual migraines and she took her home medication of Excedrin Migraine without symptom relief.  She laid in a dark room but the migraine got progressively worse.  She rates the pain 8 out of 10 in severity.  She is also been experiencing generalized abdominal pain.  She rates it 2 out of 10 in severity.  She is describes as a dull ache. She is having associated nausea and nonbloody nonbilious emesis.  She reports 4 episodes of emesis today.  She has had urinary frequency for the last 4 days without dysuria.  Patient attended a funeral 2 days ago and was around a large number of people, she is unsure if she was exposed to anyone positive for COVID-19.  Has had nasal congestion x1 day.  She denies fever, chills, chest pain, shortness of breath, cough, sore throat, flank pain, diarrhea, blood in stool, pelvic pain, vaginal bleeding or vaginal discharge.  Abdominal surgical history includes cesarean sections and bilateral tubal ligation. History provided by patient with additional history obtained from chart review.       Past Medical History:  Diagnosis Date  . Abnormal Pap smear   . Asthma    inhaler use daily  . Hyperemesis complicating pregnancy, antepartum   . Migraines     Patient Active Problem List   Diagnosis Date Noted  . S/P cesarean section 07/17/2013    Past Surgical History:  Procedure Laterality Date  . CESAREAN SECTION    . CESAREAN SECTION N/A 07/26/2012   Procedure: REPEAT CESAREAN SECTION;  Surgeon: Kathreen Cosier,  MD;  Location: [facility];  Service: Obstetrics;  Laterality: N/A;  . CESAREAN SECTION WITH BILATERAL TUBAL LIGATION Bilateral 07/17/2013   Procedure: REPEAT CESAREAN SECTION WITH BILATERAL TUBAL LIGATION;  Surgeon: Kathreen Cosier, MD;  Location: [facility];  Service: Obstetrics;  Laterality: Bilateral;     OB History    Gravida  4   Para  4   Term  4   Preterm      AB      Living  4     SAB      TAB      Ectopic      Multiple      Live Births  2            Home Medications    Prior to Admission medications   Medication Sig Start Date End Date Taking? Authorizing Provider  ibuprofen (ADVIL,MOTRIN) 800 MG tablet Take 1 tablet (800 mg total) by mouth every 8 (eight) hours as needed. Patient taking differently: Take 800 mg by mouth every 8 (eight) hours as needed for headache.  06/13/17   Lawyer, Cristal Deer, PA-C  prochlorperazine (COMPAZINE) 10 MG tablet Take 1 tablet (10 mg total) by mouth every 8 (eight) hours as needed (migraine). 08/31/17   Sabas Sous, MD  traMADol (ULTRAM) 50 MG tablet Take 1 tablet (50 mg total) by mouth every 6 (six) hours as needed for severe pain. Patient not taking: Reported on  08/31/2017 06/13/17   Dalia Heading, PA-C    Family History Family History  Problem Relation Age of Onset  . Hypertension Father   . Diabetes Father   . Cancer Maternal Grandmother 56       ovarian  . Cancer Paternal Grandmother        brain tumor    Social History Social History   Tobacco Use  . Smoking status: Current Every Day Smoker    Packs/day: 0.50    Years: 13.00    Pack years: 6.50    Types: Cigarettes  . Smokeless tobacco: Current User  Substance Use Topics  . Alcohol use: No  . Drug use: No     Allergies   Latex and Penicillins   Review of Systems Review of Systems  Constitutional: Negative for chills and fever.  HENT: Positive for congestion. Negative for ear discharge, ear pain, facial swelling, sinus pressure, sinus pain,  sore throat and trouble swallowing.   Eyes: Negative for pain and redness.  Respiratory: Negative for cough, shortness of breath, wheezing and stridor.   Cardiovascular: Negative for chest pain and palpitations.  Gastrointestinal: Positive for abdominal pain, nausea and vomiting. Negative for constipation and diarrhea.  Genitourinary: Positive for frequency. Negative for dysuria, flank pain, hematuria, pelvic pain, vaginal bleeding, vaginal discharge and vaginal pain.  Musculoskeletal: Negative for back pain and neck pain.  Skin: Negative for wound.  Neurological: Positive for headaches. Negative for weakness and numbness.     Physical Exam Updated Vital Signs BP 117/75 (BP Location: Right Arm)   Pulse 84   Temp 98.4 F (36.9 C) (Oral)   Resp 20   SpO2 100%   Physical Exam Vitals signs and nursing note reviewed.  Constitutional:      General: She is not in acute distress.    Appearance: She is not ill-appearing.  HENT:     Head: Normocephalic and atraumatic.     Comments: No sinus or temporal tenderness.    Right Ear: Tympanic membrane and external ear normal.     Left Ear: Tympanic membrane and external ear normal.     Nose: Nose normal.     Mouth/Throat:     Mouth: Mucous membranes are moist.     Pharynx: Oropharynx is clear.  Eyes:     General: No scleral icterus.       Right eye: No discharge.        Left eye: No discharge.     Extraocular Movements: Extraocular movements intact.     Conjunctiva/sclera: Conjunctivae normal.     Pupils: Pupils are equal, round, and reactive to light.  Neck:     Musculoskeletal: Normal range of motion. No muscular tenderness.     Vascular: No JVD.     Comments: No meningeal signs Cardiovascular:     Rate and Rhythm: Normal rate and regular rhythm.     Pulses: Normal pulses.          Radial pulses are 2+ on the right side and 2+ on the left side.     Heart sounds: Normal heart sounds.  Pulmonary:     Comments: Lungs clear to  auscultation in all fields. Symmetric chest rise. No wheezing, rales, or rhonchi. Abdominal:     Tenderness: There is no right CVA tenderness or left CVA tenderness.     Comments: Abdomen is soft, non-distended, normoactive bowel sounds.  Mild generalized tenderness to palpation.  No rigidity, no guarding.  No peritoneal signs.  Musculoskeletal: Normal  range of motion.  Lymphadenopathy:     Cervical: No cervical adenopathy.  Skin:    General: Skin is warm and dry.     Capillary Refill: Capillary refill takes less than 2 seconds.     Findings: No rash.  Neurological:     Mental Status: She is oriented to person, place, and time.     GCS: GCS eye subscore is 4. GCS verbal subscore is 5. GCS motor subscore is 6.     Comments: Fluent speech, no facial droop.  Speech is clear and goal oriented, follows commands CN III-XII intact, no facial droop Normal strength in upper and lower extremities bilaterally including dorsiflexion and plantar flexion, strong and equal grip strength Sensation normal to light and sharp touch Moves extremities without ataxia, coordination intact Normal finger to nose and rapid alternating movements Normal gait and balance   Psychiatric:        Behavior: Behavior normal.      ED Treatments / Results  Labs (all labs ordered are listed, but only abnormal results are displayed) Labs Reviewed  COMPREHENSIVE METABOLIC PANEL - Abnormal; Notable for the following components:      Result Value   Calcium 8.6 (*)    All other components within normal limits  CBC - Abnormal; Notable for the following components:   WBC 10.9 (*)    All other components within normal limits  SARS CORONAVIRUS 2 (TAT 6-24 HRS)  LIPASE, BLOOD  URINALYSIS, ROUTINE W REFLEX MICROSCOPIC  I-STAT BETA HCG BLOOD, ED ([facility], [facility], [facility] ONLY)    EKG None  Radiology No results found.  Procedures Procedures (including critical care time)  Medications Ordered in ED Medications  sodium  chloride flush (NS) 0.9 % injection 3 mL (has no administration in time range)  metoCLOPramide (REGLAN) injection 10 mg (10 mg Intravenous Given 12/16/18 1044)  diphenhydrAMINE (BENADRYL) injection 25 mg (25 mg Intravenous Given 12/16/18 1044)  dexamethasone (DECADRON) injection 10 mg (10 mg Intravenous Given 12/16/18 1044)  sodium chloride 0.9 % bolus 1,000 mL (0 mLs Intravenous Stopped 12/16/18 1158)     Initial Impression / Assessment and Plan / ED Course  I have reviewed the triage vital signs and the nursing notes.  Pertinent labs & imaging results that were available during my care of the patient were reviewed by me and considered in my medical decision making (see chart for details).  Patient seen and examined. Patient nontoxic appearing, in no apparent distress, vitals WNL.  On exam lungs are clear to auscultation all fields, mild generalized abdominal tenderness to palpation without peritoneal signs.  No CVA tenderness.  Neuro exam without focal deficit Lab work was ordered in triage.  I reviewed these results.  CBC with nonspecific leukocytosis of 10.9 otherwise labs are unremarkable.  no anemia, no significant electrolyte derangements. LFTs, renal function, and lipase WNL. Urinalysis without obvious infection.  Given her reassuring vitals, exam and labs I have very low suspicion for acute surgical abdomen. Will proceed with treatment for headache and nausea.  Pt HA treated with IV fluids, Reglan, Benadryl and Decadron and improved while in ED.  Presentation is like pts typical HA and non concerning [facility], [facility], Meningitis, or temporal arteritis. Pt is afebrile with no focal neuro deficits, nuchal rigidity, or change in vision.  Patient tolerating PO in the emergency department.  Send out Covid test performed as patient has possible recent exposure.  She is aware she need to continue to self quarantine and self isolate till  she have the result. The patient appears reasonably screened and/or  stabilized for discharge and I doubt any other medical condition or other [facility] requiring further screening, evaluation, or treatment in the ED at this time prior to discharge. The patient is safe for discharge with strict return precautions discussed. Recommend pcp follow up.    [name] was evaluated in Emergency Department on 12/16/2018 for the symptoms described in the history of present illness. She was evaluated in the context of the global COVID-19 pandemic, which necessitated consideration that the patient might be at risk for infection with the SARS-CoV-2 virus that causes COVID-19. Institutional protocols and algorithms that pertain to the evaluation of patients at risk for COVID-19 are in a state of rapid change based on information released by regulatory bodies including the CDC and federal and state organizations. These policies and algorithms were followed during the patient's care in the ED.   Portions of this note were generated with Scientist, clinical (histocompatibility and immunogenetics). Dictation errors may occur despite best attempts at proofreading.  Final Clinical Impressions(s) / ED Diagnoses   Final diagnoses:  Acute nonintractable headache, unspecified headache type  At increased risk of exposure to COVID-19 virus    ED Discharge Orders    None       Sherene Sires, PA-C 12/16/18 1417    Gerhard Munch, MD 12/16/18 1657

## 2018-12-16 NOTE — ED Notes (Signed)
Patient verbalizes understanding of discharge instructions. Opportunity for questioning and answers were provided. Armband removed by staff, pt discharged from ED. Ambulated out to lobby  

## 2019-04-21 ENCOUNTER — Other Ambulatory Visit: Payer: Self-pay

## 2019-04-21 ENCOUNTER — Ambulatory Visit (HOSPITAL_COMMUNITY)
Admission: EM | Admit: 2019-04-21 | Discharge: 2019-04-21 | Disposition: A | Discharge status: Home / Self Care | Payer: BC Managed Care – PPO | Attending: Family Medicine | Admitting: Family Medicine

## 2019-04-21 ENCOUNTER — Encounter (HOSPITAL_COMMUNITY): Payer: Self-pay

## 2019-04-21 DIAGNOSIS — L0291 Cutaneous abscess, unspecified: Secondary | ICD-10-CM | POA: Diagnosis not present

## 2019-04-21 MED ORDER — HYDROCODONE-ACETAMINOPHEN 5-325 MG PO TABS: 1.0000 | ORAL_TABLET | Freq: Four times a day (QID) | ORAL | 0 refills | Status: DC | PRN

## 2019-04-21 MED ORDER — DOXYCYCLINE HYCLATE 100 MG PO TABS: 100.0000 mg | ORAL_TABLET | Freq: Two times a day (BID) | ORAL | 0 refills | Status: DC

## 2019-04-21 NOTE — ED Provider Notes (Signed)
[facility]    CSN: [id number] Arrival date & time: 04/21/19  8341      History   Chief Complaint Chief Complaint  Patient presents with  . Abscess    Left Labia    HPI [name] is a 34 y.o. female.   34 year old gravida 4 para 4 woman who is status post bilateral tubal ligation comes in with left-sided skin abscess in the perineum.  This developed for the last 2 days.  She has not had this before.     Past Medical History:  Diagnosis Date  . Abnormal Pap smear   . Asthma    inhaler use daily  . Hyperemesis complicating pregnancy, antepartum   . Migraines     Patient Active Problem List   Diagnosis Date Noted  . S/P cesarean section 07/17/2013    Past Surgical History:  Procedure Laterality Date  . CESAREAN SECTION    . CESAREAN SECTION N/A 07/26/2012   Procedure: REPEAT CESAREAN SECTION;  Surgeon: Frederico Hamman, MD;  Location: [facility];  Service: Obstetrics;  Laterality: N/A;  . CESAREAN SECTION WITH BILATERAL TUBAL LIGATION Bilateral 07/17/2013   Procedure: REPEAT CESAREAN SECTION WITH BILATERAL TUBAL LIGATION;  Surgeon: Frederico Hamman, MD;  Location: [facility];  Service: Obstetrics;  Laterality: Bilateral;    OB History    Gravida  4   Para  4   Term  4   Preterm      AB      Living  4     SAB      TAB      Ectopic      Multiple      Live Births  2            Home Medications    Prior to Admission medications   Medication Sig Start Date End Date Taking? Authorizing Provider  doxycycline (VIBRA-TABS) 100 MG tablet Take 1 tablet (100 mg total) by mouth 2 (two) times daily. 04/21/19   Robyn Haber, MD  HYDROcodone-acetaminophen (NORCO) 5-325 MG tablet Take 1 tablet by mouth every 6 (six) hours as needed for moderate pain. 04/21/19   Robyn Haber, MD  prochlorperazine (COMPAZINE) 10 MG tablet Take 1 tablet (10 mg total) by mouth every 8 (eight) hours as needed (migraine). 08/31/17 04/21/19  Maudie Flakes, MD     Family History Family History  Problem Relation Age of Onset  . Hypertension Father   . Diabetes Father   . Cancer Maternal Grandmother 56       ovarian  . Cancer Paternal Grandmother        brain tumor    Social History Social History   Tobacco Use  . Smoking status: Current Every Day Smoker    Packs/day: 0.50    Years: 13.00    Pack years: 6.50    Types: Cigarettes  . Smokeless tobacco: Current User  Substance Use Topics  . Alcohol use: No  . Drug use: No     Allergies   Latex and Penicillins   Review of Systems Review of Systems  Skin: Positive for wound.  All other systems reviewed and are negative.    Physical Exam Triage Vital Signs ED Triage Vitals [04/21/19 0922]  Enc Vitals Group     BP 92/60     Pulse Rate 66     Resp 18     Temp 98.2 F (36.8 C)     Temp Source Oral  SpO2 96 %     Weight      Height      Head Circumference      Peak Flow      Pain Score 7     Pain Loc      Pain Edu?      Excl. in GC?    No data found.  Updated Vital Signs BP 92/60 (BP Location: Left Arm)   Pulse 66   Temp 98.2 F (36.8 C) (Oral)   Resp 18   SpO2 96%    Physical Exam Vitals and nursing note reviewed.  Constitutional:      Appearance: Normal appearance. She is normal weight.  HENT:     Head: Normocephalic.  Eyes:     Conjunctiva/sclera: Conjunctivae normal.  Cardiovascular:     Rate and Rhythm: Normal rate.  Pulmonary:     Effort: Pulmonary effort is normal.  Musculoskeletal:        General: Normal range of motion.     Cervical back: Normal range of motion and neck supple.  Skin:    General: Skin is warm and dry.     Comments: 1 mm subcutaneous tender nodule with overlying erythema in the left posterior perineum about 2 cm from the anal verge and 2 cm from the left labia majora  Neurological:     General: No focal deficit present.     Mental Status: She is alert.  Psychiatric:        Mood and Affect: Mood normal.         Behavior: Behavior normal.        Thought Content: Thought content normal.        Judgment: Judgment normal.      UC Treatments / Results  Labs (all labs ordered are listed, but only abnormal results are displayed) Labs Reviewed - No data to display  EKG   Radiology No results found.  Procedures Incision and Drainage  Date/Time: 04/21/2019 9:48 AM Performed by: Elvina Sidle, MD Authorized by: Elvina Sidle, MD   Consent:    Consent obtained:  Verbal   Consent given by:  Patient   Risks discussed:  Incomplete drainage and infection   Alternatives discussed:  No treatment Location:    Type:  Abscess   Size:  5 mm   Location:  Anogenital   Anogenital location:  [place] Pre-procedure details:    Skin preparation:  Antiseptic wash Anesthesia (see MAR for exact dosages):    Anesthesia method:  Local infiltration   Local anesthetic:  Lidocaine 2% WITH epi Procedure type:    Complexity:  Simple Procedure details:    Needle aspiration: no     Incision types:  Stab incision   Incision depth:  Dermal   Scalpel blade:  11   Wound management:  Probed and deloculated   Drainage:  Purulent   Drainage amount:  Moderate   Wound treatment:  Wound left open   Packing materials:  None Post-procedure details:    Patient tolerance of procedure:  Tolerated well, no immediate complications   (including critical care time)  Medications Ordered in UC Medications - No data to display  Initial Impression / Assessment and Plan / UC Course  I have reviewed the triage vital signs and the nursing notes.  Pertinent labs & imaging results that were available during my care of the patient were reviewed by me and considered in my medical decision making (see chart for details).     Final  Clinical Impressions(s) / UC Diagnoses   Final diagnoses:  Abscess   Discharge Instructions   None    ED Prescriptions    Medication Sig Dispense Auth. Provider   doxycycline  (VIBRA-TABS) 100 MG tablet Take 1 tablet (100 mg total) by mouth 2 (two) times daily. 14 tablet Elvina Sidle, MD   HYDROcodone-acetaminophen (NORCO) 5-325 MG tablet Take 1 tablet by mouth every 6 (six) hours as needed for moderate pain. 12 tablet Elvina Sidle, MD     I have reviewed the PDMP during this encounter.   Elvina Sidle, MD 04/21/19 [phone]

## 2019-04-21 NOTE — ED Triage Notes (Signed)
Pt presents with abscess on left labia since yesterday.

## 2019-08-15 ENCOUNTER — Emergency Department (HOSPITAL_COMMUNITY): Payer: BC Managed Care – PPO

## 2019-08-15 ENCOUNTER — Emergency Department (HOSPITAL_COMMUNITY)
Admission: EM | Admit: 2019-08-15 | Discharge: 2019-08-15 | Disposition: A | Discharge status: Home / Self Care | Payer: BC Managed Care – PPO | Attending: Emergency Medicine | Admitting: Emergency Medicine

## 2019-08-15 ENCOUNTER — Other Ambulatory Visit: Payer: Self-pay

## 2019-08-15 ENCOUNTER — Encounter (HOSPITAL_COMMUNITY): Payer: Self-pay | Admitting: *Deleted

## 2019-08-15 DIAGNOSIS — J45909 Unspecified asthma, uncomplicated: Secondary | ICD-10-CM | POA: Diagnosis not present

## 2019-08-15 DIAGNOSIS — Z91040 Latex allergy status: Secondary | ICD-10-CM | POA: Diagnosis not present

## 2019-08-15 DIAGNOSIS — R519 Headache, unspecified: Secondary | ICD-10-CM | POA: Diagnosis present

## 2019-08-15 DIAGNOSIS — F17210 Nicotine dependence, cigarettes, uncomplicated: Secondary | ICD-10-CM | POA: Insufficient documentation

## 2019-08-15 DIAGNOSIS — G43909 Migraine, unspecified, not intractable, without status migrainosus: Secondary | ICD-10-CM | POA: Insufficient documentation

## 2019-08-15 MED ORDER — KETOROLAC TROMETHAMINE 30 MG/ML IJ SOLN
15.0000 mg | Freq: Once | INTRAMUSCULAR | Status: AC
  Administered 2019-08-15: 15 mg via INTRAVENOUS
  Filled 2019-08-15: qty 1

## 2019-08-15 MED ORDER — SODIUM CHLORIDE 0.9 % IV BOLUS
500.0000 mL | Freq: Once | INTRAVENOUS | Status: AC
  Administered 2019-08-15: 500 mL via INTRAVENOUS

## 2019-08-15 MED ORDER — PROMETHAZINE HCL 25 MG/ML IJ SOLN
12.5000 mg | Freq: Once | INTRAMUSCULAR | Status: AC
  Administered 2019-08-15: 12.5 mg via INTRAVENOUS
  Filled 2019-08-15: qty 1

## 2019-08-15 NOTE — ED Provider Notes (Signed)
[facility] EMERGENCY DEPARTMENT Provider Note   CSN: [id number] Arrival date & time: 08/15/19  0530     History Chief Complaint  Patient presents with  . Migraine    [name] is a 34 y.o. female.  HPI Patient presents with headache.  History of headaches since she was 34 years old.  States is a typical headache for her.  Began around 3 days ago.  On the left side of her head.  States as a gets worse she gets nausea and vomiting.  States she has vision loss and often comes with these.  States the vision goes away in her left eye.  The left eye will sometimes tear.  No fevers.  Denies pregnancy.  States she has not been imaged for this in the past.  States she took Motrin without relief.    Past Medical History:  Diagnosis Date  . Abnormal Pap smear   . Asthma    inhaler use daily  . Hyperemesis complicating pregnancy, antepartum   . Migraines     Patient Active Problem List   Diagnosis Date Noted  . S/P cesarean section 07/17/2013    Past Surgical History:  Procedure Laterality Date  . CESAREAN SECTION    . CESAREAN SECTION N/A 07/26/2012   Procedure: REPEAT CESAREAN SECTION;  Surgeon: Kathreen Cosier, MD;  Location: [facility];  Service: Obstetrics;  Laterality: N/A;  . CESAREAN SECTION WITH BILATERAL TUBAL LIGATION Bilateral 07/17/2013   Procedure: REPEAT CESAREAN SECTION WITH BILATERAL TUBAL LIGATION;  Surgeon: Kathreen Cosier, MD;  Location: [facility];  Service: Obstetrics;  Laterality: Bilateral;     OB History    Gravida  4   Para  4   Term  4   Preterm      AB      Living  4     SAB      TAB      Ectopic      Multiple      Live Births  2           Family History  Problem Relation Age of Onset  . Hypertension Father   . Diabetes Father   . Cancer Maternal Grandmother 56       ovarian  . Cancer Paternal Grandmother        brain tumor    Social History   Tobacco Use  . Smoking status: Current Every Day Smoker      Packs/day: 0.50    Years: 13.00    Pack years: 6.50    Types: Cigarettes  . Smokeless tobacco: Current User  Substance Use Topics  . Alcohol use: No  . Drug use: No    Home Medications Prior to Admission medications   Medication Sig Start Date End Date Taking? Authorizing Provider  ibuprofen (ADVIL) 200 MG tablet Take 800 mg by mouth as needed for moderate pain.   Yes [provider]  HYDROcodone-acetaminophen (NORCO) 5-325 MG tablet Take 1 tablet by mouth every 6 (six) hours as needed for moderate pain. Patient not taking: Reported on 08/15/2019 04/21/19   Elvina Sidle, MD  prochlorperazine (COMPAZINE) 10 MG tablet Take 1 tablet (10 mg total) by mouth every 8 (eight) hours as needed (migraine). 08/31/17 04/21/19  Sabas Sous, MD    Allergies    Latex and Penicillins  Review of Systems   Review of Systems  Constitutional: Negative for appetite change.  HENT: Negative for congestion.   Eyes:  Positive for discharge and visual disturbance.  Gastrointestinal: Positive for nausea and vomiting.  Genitourinary: Negative for flank pain.  Musculoskeletal: Negative for back pain.  Skin: Negative for rash.  Neurological: Positive for headaches.  Psychiatric/Behavioral: Negative for confusion.    Physical Exam Updated Vital Signs BP 116/72 (BP Location: Right Arm)   Pulse (!) 53   Temp 98.9 F (37.2 C) (Oral)   Resp 16   Ht 4' 11.75" (1.518 m)   Wt 54.4 kg   SpO2 100%   BMI 23.63 kg/m   Physical Exam Vitals reviewed.  Constitutional:      Appearance: Normal appearance.  HENT:     Head: Atraumatic.  Eyes:     Extraocular Movements: Extraocular movements intact.     Pupils: Pupils are equal, round, and reactive to light.  Cardiovascular:     Rate and Rhythm: Regular rhythm.  Pulmonary:     Breath sounds: No wheezing or rhonchi.  Abdominal:     Tenderness: There is no abdominal tenderness.  Musculoskeletal:        General: No tenderness.     Cervical  back: Neck supple.  Skin:    General: Skin is warm.     Capillary Refill: Capillary refill takes less than 2 seconds.  Neurological:     Mental Status: She is alert and oriented to person, place, and time.     ED Results / Procedures / Treatments   Labs (all labs ordered are listed, but only abnormal results are displayed) Labs Reviewed - No data to display  EKG None  Radiology CT Head Wo Contrast  Result Date: 08/15/2019 CLINICAL DATA:  Headache, basilar or orbital. Additional provided: Headache for 3 days EXAM: CT HEAD WITHOUT CONTRAST TECHNIQUE: Contiguous axial images were obtained from the base of the skull through the vertex without intravenous contrast. COMPARISON:  No pertinent prior studies available for comparison. FINDINGS: Brain: Cerebral volume is normal for age. There is no acute intracranial hemorrhage. No demarcated cortical infarct. No extra-axial fluid collection. No evidence of intracranial mass. No midline shift. Partially empty sella turcica. Vascular: No hyperdense vessel. Skull: Normal. Negative for fracture or focal lesion. Sinuses/Orbits: Visualized orbits show no acute finding. Mild ethmoid sinus mucosal thickening. No significant mastoid effusion. IMPRESSION: Partially empty sella turcica. This is very commonly an incidental finding, but can be associated with idiopathic intracranial hypertension. Otherwise unremarkable non-contrast CT appearance of the brain. Mild ethmoid sinus mucosal thickening. Electronically Signed   By: Jackey Loge DO   On: 08/15/2019 07:28    Procedures Procedures (including critical care time)  Medications Ordered in ED Medications  sodium chloride 0.9 % bolus 500 mL (0 mLs Intravenous Stopped 08/15/19 0849)  ketorolac (TORADOL) 30 MG/ML injection 15 mg (15 mg Intravenous Given 08/15/19 0741)  promethazine (PHENERGAN) injection 12.5 mg (12.5 mg Intravenous Given 08/15/19 1610)    ED Course  I have reviewed the triage vital signs and the  nursing notes.  Pertinent labs & imaging results that were available during my care of the patient were reviewed by me and considered in my medical decision making (see chart for details).    MDM Rules/Calculators/A&P                          Patient with headaches.  Potentially migraines.  No relief with high flow oxygen to treat possible cluster headache.  Head CT done that showed not been done before.  Showed partially empty sella  which potentially could be significant but also just could be a normal variant.  Will follow with neurology for control of the headaches and further evaluation of the CT finding.  Patient feels better after treatment.  Discharge home. Final Clinical Impression(s) / ED Diagnoses Final diagnoses:  Migraine without status migrainosus, not intractable, unspecified migraine type    Rx / DC Orders ED Discharge Orders    None       Benjiman Core, MD 08/15/19 [phone]

## 2019-08-15 NOTE — ED Triage Notes (Signed)
Pt states onset of migraine yesterday, normally Ibuprofen helps, but it has not this time. Reports she is having some double vision and vomiting. Ambulatory with steady gait, clear speech.

## 2019-08-15 NOTE — Discharge Instructions (Addendum)
Follow-up with one of the neurology groups for further management of the headaches.  Also they can further evaluate the partially empty sella that was on your CT scan.

## 2019-08-15 NOTE — ED Notes (Signed)
Off floor to CT 

## 2019-08-17 ENCOUNTER — Encounter: Payer: Self-pay | Admitting: Neurology

## 2019-11-08 NOTE — Progress Notes (Signed)
NEUROLOGY CONSULTATION NOTE  [name] MRN: [id number] DOB: Jun 17, 1985  Referring provider: Benjiman Core, MD (ED referral) Primary care provider: No PCP  Reason for consult:  migraine  HISTORY OF PRESENT ILLNESS: [name] is a 34 year old right-handed female who presents for migraines.  History supplemented by ED notes.  She reports migraines since 34 years old.  They are described as severe left-sided pounding headache.  Aura presents as seeing spots followed by vision loss left eye for 2 days.  There is associated numbness in fingertips of left hand, nausea, vomiting, photophobia, phonophobia, osmophobia, seasick feeling and sometimes tearing and twitching of her left eye.  She denies associated numbness, weakness, ptosis, conjunctival injection, nasal congestion or speech disturbance.  They typically last 3 days and occur every 2 weeks.  Denies prodrome or postdrome.  Triggers include her period, bright lights.  Relieving factors include resting in dark room, take hot bath with rag over face.  When severe, she goes to the ED for treatment.  When seen in the ED in July 2021, CT head performed, which was personally reviewed and demonstrated a partially empty sella turcica but otherwise unremarkable.  She had an eye exam in 2020 and needed new glasses.  Current NSAIDS:  Ibuprofen 800mg  Current analgesics:  none Current triptans:  none Current ergotamine:  none Current anti-emetic:  none Current muscle relaxants:  none Current anti-anxiolytic:  none Current sleep aide:  none Current Antihypertensive medications:  none Current Antidepressant medications:  none Current Anticonvulsant medications:  none Current anti-CGRP:  none Current Vitamins/Herbal/Supplements:  none Current Antihistamines/Decongestants:  none Other therapy:  none Hormone/birth control:  none  Past NSAIDS:  naproxen Past analgesics:  Tylenol, Excedrin Migraine Past abortive triptans:  none Past  abortive ergotamine:  none Past muscle relaxants:  none Past anti-emetic:  Compazine Past antihypertensive medications:  none Past antidepressant medications:  none Past anticonvulsant medications:  none Past anti-CGRP:  none Past vitamins/Herbal/Supplements:  none Past antihistamines/decongestants:  none Other past therapies:  none  Caffeine: 1 cup coffee daily, Pepsi not daily Diet:  Water, fruit juice, skips meals Exercise:  no Depression:  some; Anxiety:  yes Other pain:  none Sleep hygiene:  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  Sleeps no more than 5 hours Family history of headache: no   PAST MEDICAL HISTORY: Past Medical History:  Diagnosis Date  . Abnormal Pap smear   . Asthma    inhaler use daily  . Hyperemesis complicating pregnancy, antepartum   . Migraines     PAST SURGICAL HISTORY: Past Surgical History:  Procedure Laterality Date  . CESAREAN SECTION    . CESAREAN SECTION N/A 07/26/2012   Procedure: REPEAT CESAREAN SECTION;  Surgeon: 07/28/2012, MD;  Location: [facility];  Service: Obstetrics;  Laterality: N/A;  . CESAREAN SECTION WITH BILATERAL TUBAL LIGATION Bilateral 07/17/2013   Procedure: REPEAT CESAREAN SECTION WITH BILATERAL TUBAL LIGATION;  Surgeon: 09/16/2013, MD;  Location: [facility];  Service: Obstetrics;  Laterality: Bilateral;    MEDICATIONS: Current Outpatient Medications on File Prior to Visit  Medication Sig Dispense Refill  . HYDROcodone-acetaminophen (NORCO) 5-325 MG tablet Take 1 tablet by mouth every 6 (six) hours as needed for moderate pain. (Patient not taking: Reported on 08/15/2019) 12 tablet 0  . ibuprofen (ADVIL) 200 MG tablet Take 800 mg by mouth as needed for moderate pain.    . [DISCONTINUED] prochlorperazine (COMPAZINE) 10 MG tablet  Take 1 tablet (10 mg total) by mouth every 8 (eight) hours as needed (migraine). 10 tablet 0   No current facility-administered medications on file  prior to visit.    ALLERGIES: Allergies  Allergen Reactions  . Latex Swelling and Other (See Comments)    Turn red  . Penicillins Swelling    Has patient had a PCN reaction causing immediate rash, facial/tongue/throat swelling, SOB or lightheadedness with hypotension: No Has patient had a PCN reaction causing severe rash involving mucus membranes or skin necrosis: No Has patient had a PCN reaction that required hospitalization: No Has patient had a PCN reaction occurring within the last 10 years: No If all of the above answers are "NO", then may proceed with Cephalosporin use.     FAMILY HISTORY: Family History  Problem Relation Age of Onset  . Hypertension Father   . Diabetes Father   . Cancer Maternal Grandmother 56       ovarian  . Cancer Paternal Grandmother        brain tumor    SOCIAL HISTORY: Social History   Socioeconomic History  . Marital status: Single    Spouse name: Not on file  . Number of children: Not on file  . Years of education: Not on file  . Highest education level: Not on file  Occupational History  . Not on file  Tobacco Use  . Smoking status: Current Every Day Smoker    Packs/day: 0.50    Years: 13.00    Pack years: 6.50    Types: Cigarettes  . Smokeless tobacco: Current User  Substance and Sexual Activity  . Alcohol use: No  . Drug use: No  . Sexual activity: Yes  Other Topics Concern  . Not on file  Social History Narrative  . Not on file   Social Determinants of Health   Financial Resource Strain:   . Difficulty of Paying Living Expenses: Not on file  Food Insecurity:   . Worried About Programme researcher, broadcasting/film/video in the Last Year: Not on file  . Ran Out of Food in the Last Year: Not on file  Transportation Needs:   . Lack of Transportation (Medical): Not on file  . Lack of Transportation (Non-Medical): Not on file  Physical Activity:   . Days of Exercise per Week: Not on file  . Minutes of Exercise per Session: Not on file    Stress:   . Feeling of Stress : Not on file  Social Connections:   . Frequency of Communication with Friends and Family: Not on file  . Frequency of Social Gatherings with Friends and Family: Not on file  . Attends Religious Services: Not on file  . Active Member of Clubs or Organizations: Not on file  . Attends Banker Meetings: Not on file  . Marital Status: Not on file  Intimate Partner Violence:   . Fear of Current or Ex-Partner: Not on file  . Emotionally Abused: Not on file  . Physically Abused: Not on file  . Sexually Abused: Not on file    PHYSICAL EXAM: Blood pressure 107/64, pulse 86, height 4\' 11"  (1.499 m), weight 128 lb 12.8 oz (58.4 kg), SpO2 99 %. General: No acute distress.  Patient appears well-groomed.   Head:  Normocephalic/atraumatic Eyes:  fundi examined but not visualized Neck: supple, no paraspinal tenderness, full range of motion Back: No paraspinal tenderness Heart: regular rate and rhythm Lungs: Clear to auscultation bilaterally. Vascular: No carotid bruits. Neurological Exam:  Mental status: alert and oriented to person, place, and time, recent and remote memory intact, fund of knowledge intact, attention and concentration intact, speech fluent and not dysarthric, language intact. Cranial nerves: CN I: not tested CN II: pupils equal, round and reactive to light, visual fields intact CN III, IV, VI:  full range of motion, no nystagmus, no ptosis CN V: facial sensation intact CN VII: upper and lower face symmetric CN VIII: hearing intact CN IX, X: gag intact, uvula midline CN XI: sternocleidomastoid and trapezius muscles intact CN XII: tongue midline Bulk & Tone: normal, no fasciculations. Motor:  5/5 throughout  Sensation:  Pinprick and vibration sensation intact. Deep Tendon Reflexes:  2+ throughout, toes downgoing.   Finger to nose testing:  Without dysmetria.   Heel to shin:  Without dysmetria.   Gait:  Normal station and stride.   Romberg negative.  IMPRESSION: 1. Migraine with aura, without status migrainosus, intractable 2.  Empty sella turcica.  Often normal finding but may represent increased intracranial hypertension.  She describes vision loss for 2 days with headache.  I suspect that this represents migraine aura as it is only in one eye, therefore making visual obscurations as seen in increased intracranial hypertension less likely.  PLAN: 1. For further evaluation of increased intracranial hypertension, will order MRI/MRV of head and refer to ophthalmology.  Defer lumbar puncture unless testing positive.  2. For preventative management, start topiramate 25mg  at bedtime.  We can increase to 50mg  at bedtime in 4 weeks if needed.  Side effects discussed.  Check baseline BMP 3.  For abortive therapy, rizatriptan 10mg  and Zofran 4mg . 4.  Discontinue OTC analgesics.  Limit use of pain relievers to no more than 2 days out of week to prevent risk of rebound or medication-overuse headache. 5.  Keep headache diary 6.  Exercise, hydration, caffeine cessation, sleep hygiene, monitor for and avoid triggers 7.  Follow up 4 to 6 months   Thank you for allowing me to take part in the care of this patient.  , DO

## 2019-11-13 ENCOUNTER — Other Ambulatory Visit (INDEPENDENT_AMBULATORY_CARE_PROVIDER_SITE_OTHER): Payer: BC Managed Care – PPO

## 2019-11-13 ENCOUNTER — Ambulatory Visit (INDEPENDENT_AMBULATORY_CARE_PROVIDER_SITE_OTHER): Payer: BC Managed Care – PPO | Admitting: Neurology

## 2019-11-13 ENCOUNTER — Other Ambulatory Visit: Payer: Self-pay

## 2019-11-13 ENCOUNTER — Encounter: Payer: Self-pay | Admitting: Neurology

## 2019-11-13 VITALS — BP 107/64 | HR 86 | Ht 59.0 in | Wt 128.8 lb

## 2019-11-13 DIAGNOSIS — E236 Other disorders of pituitary gland: Secondary | ICD-10-CM | POA: Diagnosis not present

## 2019-11-13 DIAGNOSIS — H5462 Unqualified visual loss, left eye, normal vision right eye: Secondary | ICD-10-CM | POA: Diagnosis not present

## 2019-11-13 DIAGNOSIS — G43119 Migraine with aura, intractable, without status migrainosus: Secondary | ICD-10-CM

## 2019-11-13 LAB — BASIC METABOLIC PANEL
BUN: 14 mg/dL (ref 6–23)
CO2: 27 mEq/L (ref 19–32)
Calcium: 8.9 mg/dL (ref 8.4–10.5)
Chloride: 106 mEq/L (ref 96–112)
Creatinine, Ser: 0.64 mg/dL (ref 0.40–1.20)
GFR: 128.45 mL/min (ref >60.00)
Glucose, Bld: 85 mg/dL (ref 70–99)
Potassium: 4 mEq/L (ref 3.5–5.1)
Sodium: 140 mEq/L (ref 135–145)

## 2019-11-13 MED ORDER — ONDANSETRON 4 MG PO TBDP: 4.0000 mg | ORAL_TABLET | Freq: Three times a day (TID) | ORAL | 5 refills | Status: AC | PRN

## 2019-11-13 MED ORDER — RIZATRIPTAN BENZOATE 10 MG PO TBDP: ORAL_TABLET | ORAL | 5 refills | Status: AC

## 2019-11-13 MED ORDER — TOPIRAMATE 25 MG PO TABS: 25.0000 mg | ORAL_TABLET | Freq: Every day | ORAL | 5 refills | Status: AC

## 2019-11-13 NOTE — Patient Instructions (Addendum)
  1. Start topiramate 25mg  at bedtime.  Contact in 4 weeks with update and we can increase dose if needed. 2. Take rizatriptan 10mg  at earliest onset of headache.  May repeat dose once in 2 hours if needed.  Maximum 2 tablets in 24 hours. 3. Take ondansetron when you get a migraine for nausea 4. Will get MRI and MRV of head 5. Will refer you to ophthalmology 6. Limit use of pain relievers to no more than 2 days out of the week.  These medications include acetaminophen, NSAIDs (ibuprofen/Advil/Motrin, naproxen/Aleve, triptans (Imitrex/sumatriptan), Excedrin, and narcotics.  This will help reduce risk of rebound headaches. 7. Be aware of common food triggers:  - Caffeine:  coffee, black tea, cola, Mt. Dew  - Chocolate  - Dairy:  aged cheeses (brie, blue, cheddar, gouda, Tracy, provolone, [place]iss, Swiss, etc), chocolate milk, buttermilk, sour cream, limit eggs and yogurt  - Nuts, peanut butter  - Alcohol  - Cereals/grains:  FRESH breads (fresh bagels, sourdough, doughnuts), yeast productions  - Processed/canned/aged/cured meats (pre-packaged deli meats, hotdogs)  - MSG/glutamate:  soy sauce, flavor enhancer, pickled/preserved/marinated foods  - Sweeteners:  aspartame (Equal, Nutrasweet).  Sugar and Splenda are okay  - Vegetables:  legumes (lima beans, lentils, snow peas, fava beans, pinto peans, peas, garbanzo beans), sauerkraut, onions, olives, pickles  - Fruit:  avocados, bananas, citrus fruit (orange, lemon, grapefruit), mango  - Other:  Frozen meals, macaroni and cheese 8. Routine exercise 9. Stay adequately hydrated (aim for 64 oz water daily) 10. Keep headache diary 11. Maintain proper stress management 12. Maintain proper sleep hygiene 13. Do not skip meals 14. Consider supplements:  magnesium citrate 400mg  daily, riboflavin 400mg  daily, coenzyme Q10 100mg  three times daily. 15. Follow up 4 to 6 months

## 2019-12-04 ENCOUNTER — Telehealth: Payer: Self-pay | Admitting: Neurology

## 2019-12-04 NOTE — Telephone Encounter (Signed)
Any insite?

## 2019-12-04 NOTE — Telephone Encounter (Signed)
I don't know.  I would see if she can sign up for the [facility].

## 2019-12-04 NOTE — Telephone Encounter (Signed)
Spoke with patient she is going to call [employer].

## 2019-12-04 NOTE — Telephone Encounter (Signed)
Patient called in stating she was supposed to have an ultrasound and CT scan on her brain tomorrow, but they are stating she needs to pay $2,000 up front. She stated she doesn't have that kind of money. She would like to find out what another option would be?

## 2019-12-05 ENCOUNTER — Other Ambulatory Visit: Payer: BC Managed Care – PPO

## 2020-03-27 NOTE — Progress Notes (Deleted)
NEUROLOGY FOLLOW UP OFFICE NOTE  [name] [id number]   Subjective:  [name] is a 35 year old right-handed female follows up for migraines.  UPDATE: Due to concerns about increased intracranial pressure, MRI and MRV of brain were ordered but patient was unable to afford it. 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  BMP from 11/13/2019 normal. Intensity:  *** Duration:  *** Frequency:  *** Frequency of abortive medication: *** Current NSAIDS:  none Current analgesics:  none Current triptans:  rizatriptan 10mg  Current ergotamine:  none Current anti-emetic:  Zofran 4mg  Current muscle relaxants:  none Current anti-anxiolytic:  none Current sleep aide:  none Current Antihypertensive medications:  none Current Antidepressant medications:  none Current Anticonvulsant medications:  topiramate 25mg  at bedtime Current anti-CGRP:  none Current Vitamins/Herbal/Supplements:  none Current Antihistamines/Decongestants:  none Other therapy:  none Hormone/birth control:  none  Caffeine: 1 cup coffee daily, Pepsi not daily Diet:  Water, fruit juice, skips meals Exercise:  no Depression:  some; Anxiety:  yes Other pain:  none Sleep hygiene:  Works at [employer] ( at ), goes to school, has 4 children.  Sleeps no more than 5 hours  HISTORY: She reports migraines since 35 years old.  They are described as severe left-sided pounding headache.  Aura presents as seeing spots followed by vision loss left eye for 2 days.  There is associated numbness in fingertips of left hand, nausea, vomiting, photophobia, phonophobia, osmophobia, seasick feeling and sometimes tearing and twitching of her left eye.  She denies associated numbness, weakness, ptosis, conjunctival injection, nasal congestion or speech disturbance.  They typically last 3 days and occur every 2 weeks.  Denies prodrome or  postdrome.  Triggers include her period, bright lights.  Relieving factors include resting in dark room, take hot bath with rag over face.  When severe, she goes to the ED for treatment.  When seen in the ED in July 2021, CT head performed, which was personally reviewed and demonstrated a partially empty sella turcica but otherwise unremarkable.  She had an eye exam in 2020 and needed new glasses.   Past NSAIDS:  naproxen, ibuprofen 800mg  Past analgesics:  Tylenol, Excedrin Migraine Past abortive triptans:  none Past abortive ergotamine:  none Past muscle relaxants:  none Past anti-emetic:  Compazine Past antihypertensive medications:  none Past antidepressant medications:  none Past anticonvulsant medications:  none Past anti-CGRP:  none Past vitamins/Herbal/Supplements:  none Past antihistamines/decongestants:  none Other past therapies:  none   Family history of headache: no  PAST MEDICAL HISTORY: Past Medical History:  Diagnosis Date  . Abnormal Pap smear   . Asthma    inhaler use daily  . Hyperemesis complicating pregnancy, antepartum   . Migraines     MEDICATIONS: Current Outpatient Medications on File Prior to Visit  Medication Sig Dispense Refill  . ondansetron (ZOFRAN ODT) 4 MG disintegrating tablet Take 1 tablet (4 mg total) by mouth every 8 (eight) hours as needed for nausea or vomiting. 20 tablet 5  . rizatriptan (MAXALT-MLT) 10 MG disintegrating tablet Take 1 tablet earliest onset of migraine.  May repeat in 2 hours if needed.  Maximum 2 tablets in 24 hours. 10 tablet 5  . topiramate (TOPAMAX) 25 MG tablet Take 1 tablet (25 mg total) by mouth at bedtime. 30 tablet 5  . [DISCONTINUED] prochlorperazine (COMPAZINE) 10 MG tablet Take 1 tablet (10 mg total) by  mouth every 8 (eight) hours as needed (migraine). 10 tablet 0   No current facility-administered medications on file prior to visit.    ALLERGIES: Allergies  Allergen Reactions  . Latex Swelling and  Other (See Comments)    Turn red  . Penicillins Swelling    Has patient had a PCN reaction causing immediate rash, facial/tongue/throat swelling, SOB or lightheadedness with hypotension: No Has patient had a PCN reaction causing severe rash involving mucus membranes or skin necrosis: No Has patient had a PCN reaction that required hospitalization: No Has patient had a PCN reaction occurring within the last 10 years: No If all of the above answers are "NO", then may proceed with Cephalosporin use.     FAMILY HISTORY: Family History  Problem Relation Age of Onset  . Hypertension Father   . Diabetes Father   . Cancer Maternal Grandmother 56       ovarian  . Cancer Paternal Grandmother        brain tumor     SOCIAL HISTORY: Social History   Socioeconomic History  . Marital status: Single    Spouse name: Not on file  . Number of children: Not on file  . Years of education: Not on file  . Highest education level: Not on file  Occupational History  . Not on file  Tobacco Use  . Smoking status: Current Every Day Smoker    Packs/day: 0.50    Years: 13.00    Pack years: 6.50    Types: Cigarettes  . Smokeless tobacco: Current User  Vaping Use  . Vaping Use: Never used  Substance and Sexual Activity  . Alcohol use: Yes    Comment: oacc.  . Drug use: No  . Sexual activity: Yes  Other Topics Concern  . Not on file  Social History Narrative   Right handed   Lives with 4 kids in one story home   Social Determinants of Health   Financial Resource Strain: Not on file  Food Insecurity: Not on file  Transportation Needs: Not on file  Physical Activity: Not on file  Stress: Not on file  Social Connections: Not on file  Intimate Partner Violence: Not on file     Objective:  *** General: No acute distress.  Patient appears well-groomed.   Head:  Normocephalic/atraumatic Eyes:  Fundi examined but not visualized Neck: supple, no paraspinal tenderness, full range of  motion Heart:  Regular rate and rhythm Lungs:  Clear to auscultation bilaterally Back: No paraspinal tenderness Neurological Exam: alert and oriented to person, place, and time. Attention span and concentration intact, recent and remote memory intact, fund of knowledge intact.  Speech fluent and not dysarthric, language intact.  CN II-XII intact. Bulk and tone normal, muscle strength 5/5 throughout.  Sensation to light touch, temperature and vibration intact.  Deep tendon reflexes 2+ throughout, toes downgoing.  Finger to nose and heel to shin testing intact.  Gait normal, Romberg negative.   Assessment/Plan:   1.  Migraine with aura, without status migrainosus, not intractable 2.  Empty sella turcica.  Often normal finding but may represent increased intracranial hypertension.  She describes vision loss for 2 days with headache.  I suspect that this represents migraine aura as it is only in one eye, therefore making visual obscurations as seen in increased intracranial hypertension less likely.  1.  ***  Shon Millet, DO  CC: ***

## 2020-03-29 ENCOUNTER — Encounter: Payer: Self-pay | Admitting: Neurology

## 2020-03-29 ENCOUNTER — Ambulatory Visit: Payer: BC Managed Care – PPO | Admitting: Neurology

## 2020-03-29 DIAGNOSIS — Z029 Encounter for administrative examinations, unspecified: Secondary | ICD-10-CM

## 2021-08-31 IMAGING — CT CT HEAD W/O CM
4 series · 15 of 47 positions shown, 17 images · non-contrast
Comparison: No pertinent prior studies available for comparison.

CLINICAL DATA: Headache, basilar or orbital. Additional provided:
Headache for 3 days

EXAM:
CT HEAD WITHOUT CONTRAST
TECHNIQUE: Contiguous axial images were obtained from the base of the skull
through the vertex without intravenous contrast.

[Series 3: head without · axial · non-contrast · 0.41mm/px · z∈[-102,+13]mm · 7 of 31 slices shown, 9 images]
[im 4/31  brain]
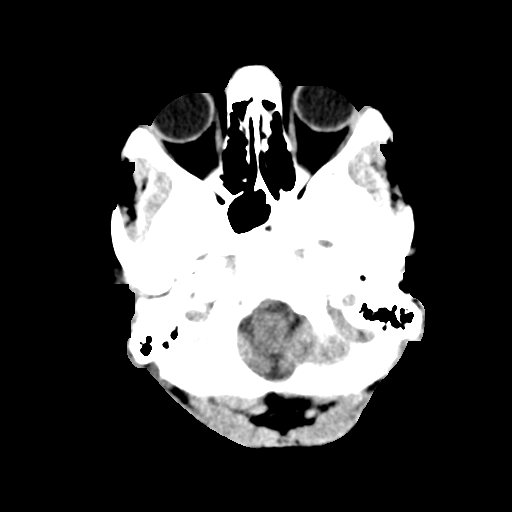
[im 4/31  bone]
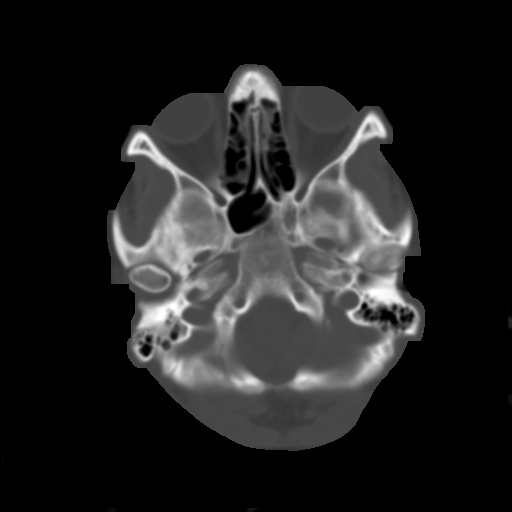
[im 8/31  brain]
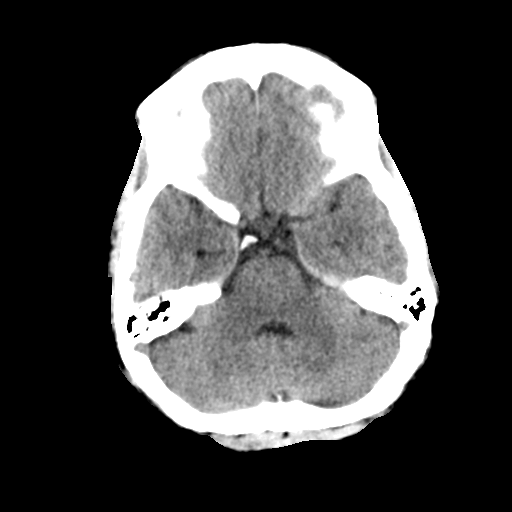
[im 12/31  brain]
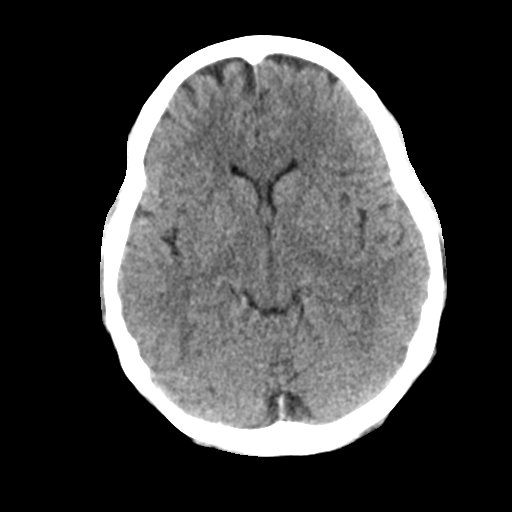
[im 16/31  brain]
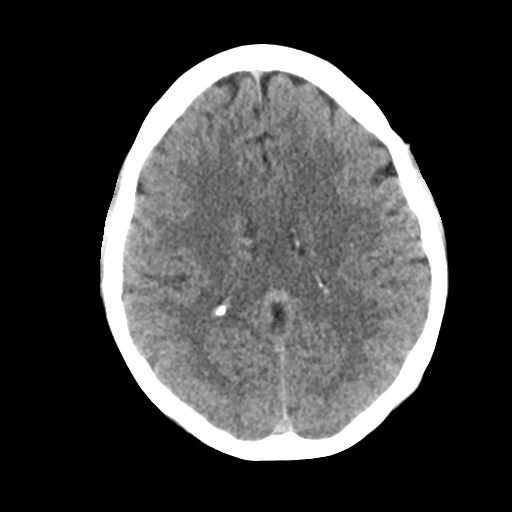
[im 19/31  brain]
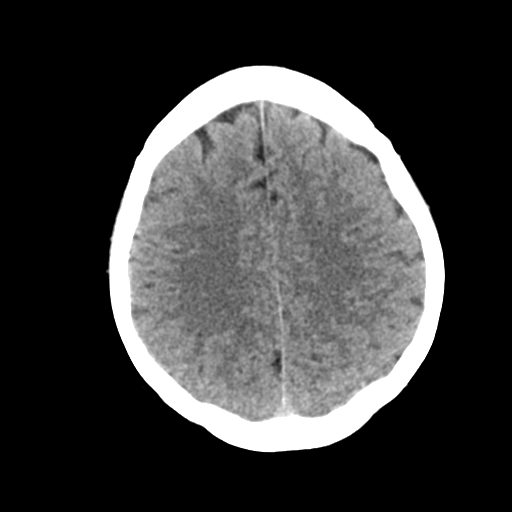
[im 19/31  bone]
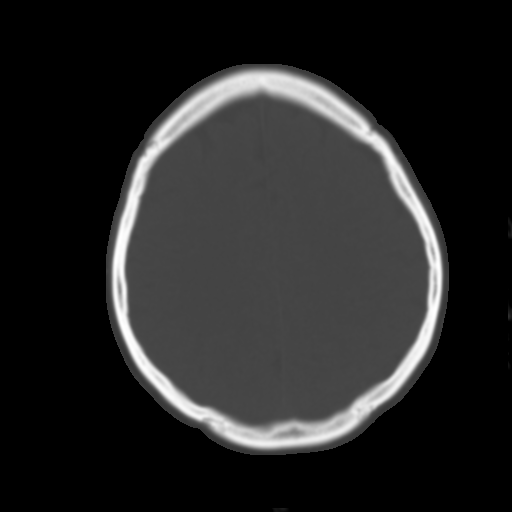
[im 23/31  brain]
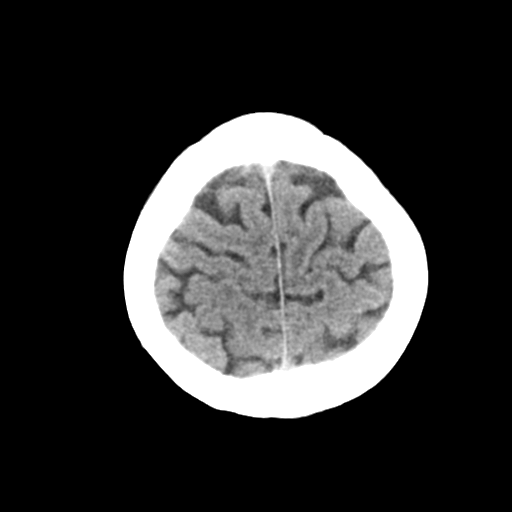
[im 27/31  brain]
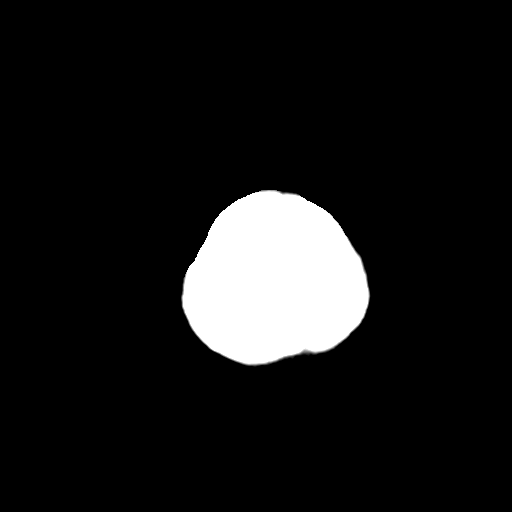

[Series 4: head bone · axial · 0.41mm/px · z∈[-103,-87]mm · 2 of 77 slices shown]
[im 8/77  bone]
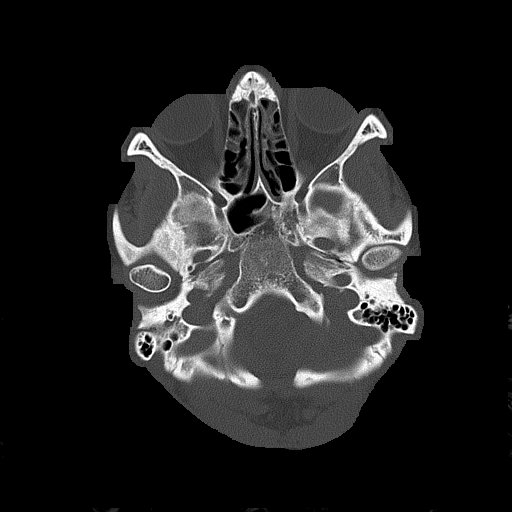
[im 16/77  bone]
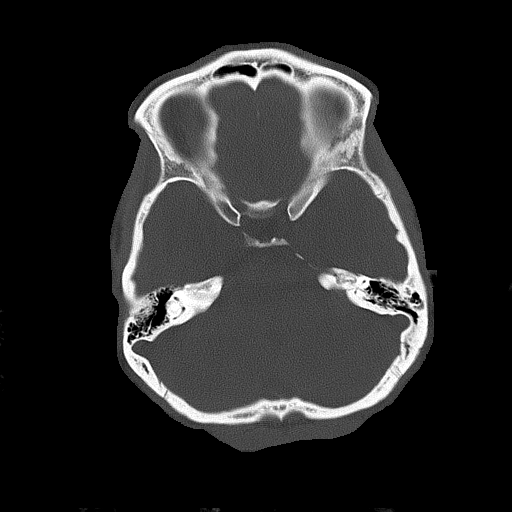

[Series 5: head without cor · coronal · non-contrast · 0.30mm/px · 3 of 67 slices shown]
[im 23/67  brain]
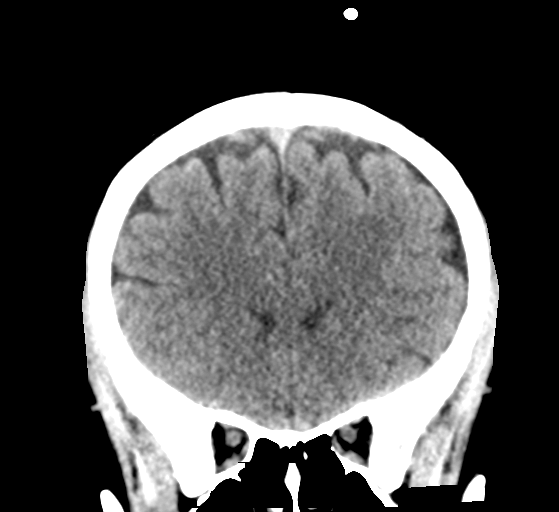
[im 30/67  brain]
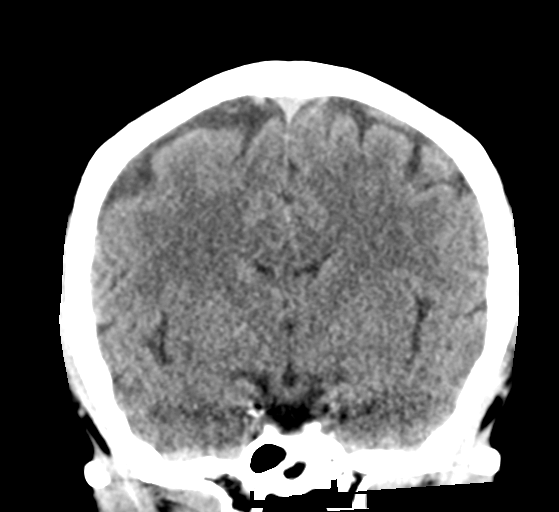
[im 37/67  brain]
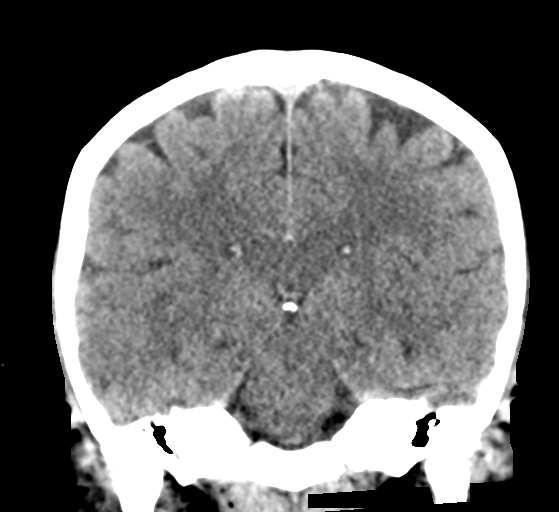

[Series 6: head without sag · sagittal · non-contrast · 0.30mm/px · 3 of 60 slices shown]
[im 20/60  brain]
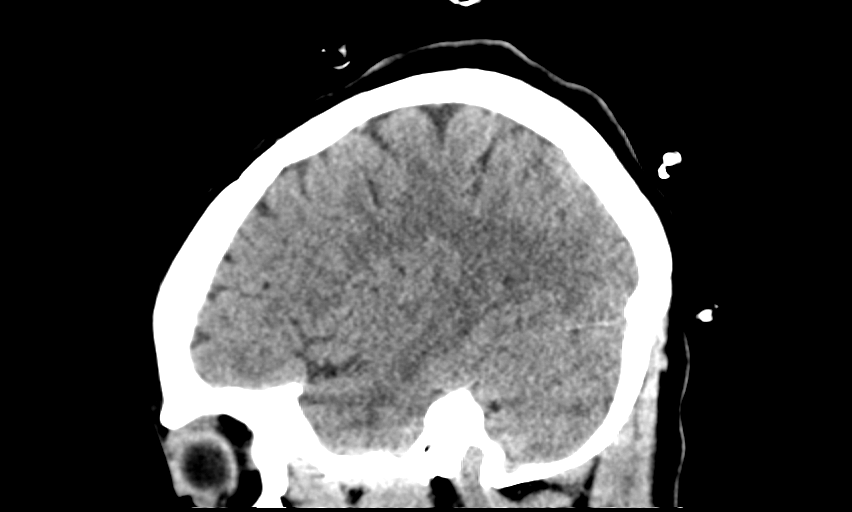
[im 30/60  brain]
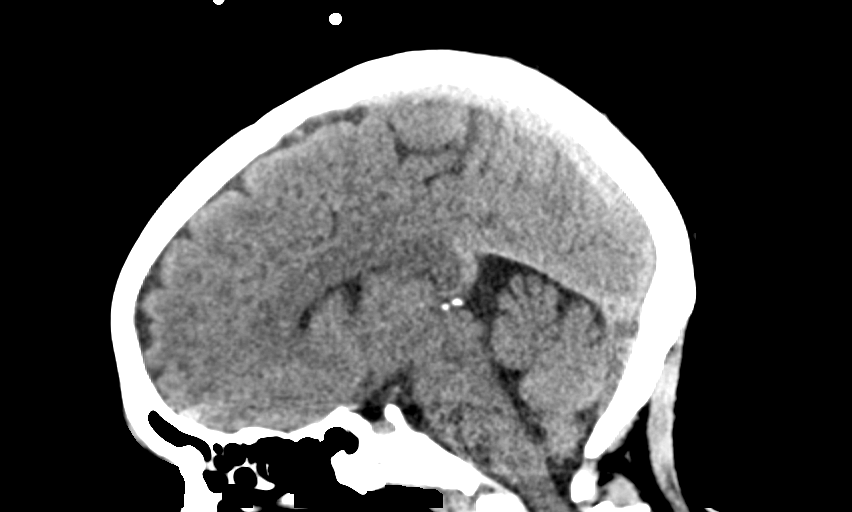
[im 40/60  brain]
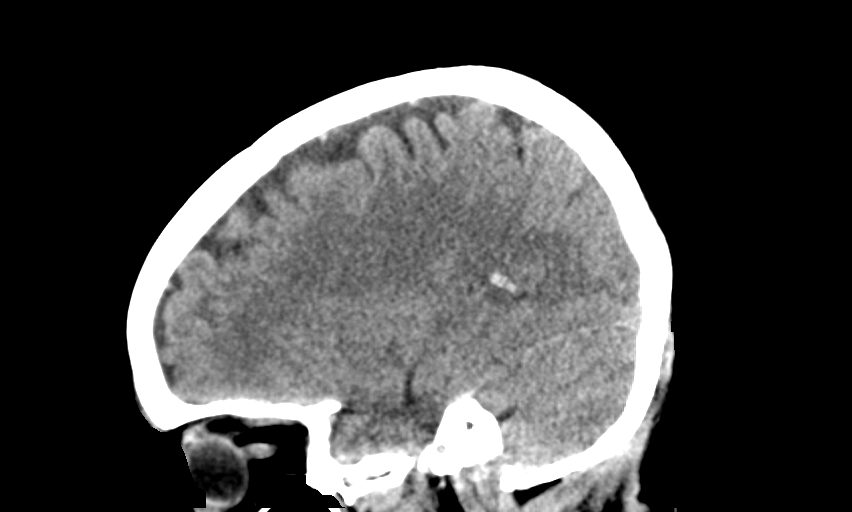

[15 of 47 positions shown; findings below may reference images not displayed]

FINDINGS: Brain:

Cerebral volume is normal for age.

There is no acute intracranial hemorrhage.

No demarcated cortical infarct.

No extra-axial fluid collection.

No evidence of intracranial mass.

No midline shift.

Partially empty sella turcica.

Vascular: No hyperdense vessel.

Skull: Normal. Negative for fracture or focal lesion.

Sinuses/Orbits: Visualized orbits show no acute finding. Mild
ethmoid sinus mucosal thickening. No significant mastoid effusion.
IMPRESSION: Partially empty sella turcica. This is very commonly an incidental
finding, but can be associated with idiopathic intracranial
hypertension.

Otherwise unremarkable non-contrast CT appearance of the brain.

Mild ethmoid sinus mucosal thickening.

## 2023-10-19 ENCOUNTER — Emergency Department (HOSPITAL_COMMUNITY): Payer: Self-pay

## 2023-10-19 ENCOUNTER — Other Ambulatory Visit: Payer: Self-pay

## 2023-10-19 ENCOUNTER — Emergency Department (HOSPITAL_COMMUNITY)
Admission: EM | Admit: 2023-10-19 | Discharge: 2023-10-20 | Disposition: A | Discharge status: Home / Self Care | Payer: Self-pay | Attending: Emergency Medicine | Admitting: Emergency Medicine

## 2023-10-19 ENCOUNTER — Encounter (HOSPITAL_COMMUNITY): Payer: Self-pay

## 2023-10-19 DIAGNOSIS — J45909 Unspecified asthma, uncomplicated: Secondary | ICD-10-CM | POA: Insufficient documentation

## 2023-10-19 DIAGNOSIS — Z91040 Latex allergy status: Secondary | ICD-10-CM | POA: Insufficient documentation

## 2023-10-19 DIAGNOSIS — R519 Headache, unspecified: Secondary | ICD-10-CM | POA: Insufficient documentation

## 2023-10-19 LAB — I-STAT CHEM 8, ED
BUN: 7 mg/dL (ref 6–20)
Calcium, Ion: 1.06 mmol/L — ABNORMAL LOW (ref 1.15–1.40)
Chloride: 107 mmol/L (ref 98–111)
Creatinine, Ser: 0.6 mg/dL (ref 0.44–1.00)
Glucose, Bld: 81 mg/dL (ref 70–99)
HCT: 37 % (ref 36.0–46.0)
Hemoglobin: 12.6 g/dL (ref 12.0–15.0)
Potassium: 3.9 mmol/L (ref 3.5–5.1)
Sodium: 138 mmol/L (ref 135–145)
TCO2: 21 mmol/L — ABNORMAL LOW (ref 22–32)

## 2023-10-19 LAB — COMPREHENSIVE METABOLIC PANEL WITH GFR
ALT: 10 U/L (ref 0–44)
AST: 18 U/L (ref 15–41)
Albumin: 4 g/dL (ref 3.5–5.0)
Alkaline Phosphatase: 42 U/L (ref 38–126)
Anion gap: 10 (ref 5–15)
BUN: 7 mg/dL (ref 6–20)
CO2: 20 mmol/L — ABNORMAL LOW (ref 22–32)
Calcium: 8.6 mg/dL — ABNORMAL LOW (ref 8.9–10.3)
Chloride: 108 mmol/L (ref 98–111)
Creatinine, Ser: 0.64 mg/dL (ref 0.44–1.00)
GFR, Estimated: >60 mL/min (ref >60)
Glucose, Bld: 80 mg/dL (ref 70–99)
Potassium: 3.9 mmol/L (ref 3.5–5.1)
Sodium: 138 mmol/L (ref 135–145)
Total Bilirubin: 0.3 mg/dL (ref 0.0–1.2)
Total Protein: 6.9 g/dL (ref 6.5–8.1)

## 2023-10-19 LAB — CBC WITH DIFFERENTIAL/PLATELET
Abs Immature Granulocytes: 0.02 K/uL (ref 0.00–0.07)
Basophils Absolute: 0.1 K/uL (ref 0.0–0.1)
Basophils Relative: 1 %
Eosinophils Absolute: 0 K/uL (ref 0.0–0.5)
Eosinophils Relative: 0 %
HCT: 37.7 % (ref 36.0–46.0)
Hemoglobin: 12.3 g/dL (ref 12.0–15.0)
Immature Granulocytes: 0 %
Lymphocytes Relative: 45 %
Lymphs Abs: 2.9 K/uL (ref 0.7–4.0)
MCH: 32.5 pg (ref 26.0–34.0)
MCHC: 32.6 g/dL (ref 30.0–36.0)
MCV: 99.7 fL (ref 80.0–100.0)
Monocytes Absolute: 0.4 K/uL (ref 0.1–1.0)
Monocytes Relative: 6 %
Neutro Abs: 3.1 K/uL (ref 1.7–7.7)
Neutrophils Relative %: 48 %
Platelets: 276 K/uL (ref 150–400)
RBC: 3.78 MIL/uL — ABNORMAL LOW (ref 3.87–5.11)
RDW: 12 % (ref 11.5–15.5)
WBC: 6.5 K/uL (ref 4.0–10.5)
nRBC: 0 % (ref 0.0–0.2)

## 2023-10-19 LAB — HCG, SERUM, QUALITATIVE: Preg, Serum: NEGATIVE

## 2023-10-19 MED ORDER — LACTATED RINGERS IV BOLUS
1000.0000 mL | Freq: Once | INTRAVENOUS | Status: AC
  Administered 2023-10-19: 1000 mL via INTRAVENOUS

## 2023-10-19 MED ORDER — DIPHENHYDRAMINE HCL 50 MG/ML IJ SOLN
12.5000 mg | Freq: Once | INTRAMUSCULAR | Status: AC
  Administered 2023-10-19: 12.5 mg via INTRAVENOUS
  Filled 2023-10-19: qty 1

## 2023-10-19 MED ORDER — PROCHLORPERAZINE EDISYLATE 10 MG/2ML IJ SOLN
10.0000 mg | Freq: Once | INTRAMUSCULAR | Status: AC
  Administered 2023-10-19: 10 mg via INTRAVENOUS
  Filled 2023-10-19: qty 2

## 2023-10-19 MED ORDER — KETOROLAC TROMETHAMINE 15 MG/ML IJ SOLN
15.0000 mg | Freq: Once | INTRAMUSCULAR | Status: AC
  Administered 2023-10-19: 15 mg via INTRAVENOUS
  Filled 2023-10-19: qty 1

## 2023-10-19 NOTE — ED Provider Notes (Signed)
 [place] [facility]PARTMENT AT [facility] Provider Note   CSN: [id number] Arrival date & time: 10/19/23  1102     Patient presents with: Loss of Vision and Headache   [name] is a 38 y.o. female.    Headache Patient presents for headache.  Medical history includes asthma, migraines.  She reports onset of migraines 5 years ago.  At that time, they were typically left-sided and she would have blurry vision and scotomas on left side.  She was seen by neurology.  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  Neurology felt like migraines are more likely.  She was prescribed rizatriptan  for abortive therapy.  She states that her migraines have been well-controlled for the past several years.  She had onset of current headache 2 days ago.  It has been left-sided.  She has had associated left eye blurry vision and scotomas.  Symptoms worsened today while at work.  Patient works at [employer].  She did leave work early and came to the ED.     Prior to Admission medications   Medication Sig Start Date End Date Taking? Authorizing Provider  ondansetron  (ZOFRAN  ODT) 4 MG disintegrating tablet Take 1 tablet (4 mg total) by mouth every 8 (eight) hours as needed for nausea or vomiting. 11/13/19   Skeet, Adam R, DO  rizatriptan  (MAXALT -MLT) 10 MG disintegrating tablet Take 1 tablet earliest onset of migraine.  May repeat in 2 hours if needed.  Maximum 2 tablets in 24 hours. 11/13/19   Skeet Juliene SAUNDERS, DO  topiramate  (TOPAMAX ) 25 MG tablet Take 1 tablet (25 mg total) by mouth at bedtime. 11/13/19   Skeet, Adam R, DO  prochlorperazine  (COMPAZINE ) 10 MG tablet Take 1 tablet (10 mg total) by mouth every 8 (eight) hours as needed (migraine). 08/31/17 04/21/19  Theadore Ozell HERO, MD    Allergies: Latex and Penicillins    Review of Systems  Eyes:  Positive for visual disturbance.  Neurological:  Positive for headaches.  All other systems reviewed and are  negative.   Updated Vital Signs BP 136/77 (BP Location: Right Arm)   Pulse 67   Temp 98.3 F (36.8 C)   Resp 18   Ht 4' 11 (1.499 m)   Wt 54.4 kg   SpO2 100%   BMI 24.24 kg/m   Physical Exam Vitals and nursing note reviewed.  Constitutional:      General: She is not in acute distress.    Appearance: She is well-developed. She is not ill-appearing, toxic-appearing or diaphoretic.  HENT:     Head: Normocephalic and atraumatic.     Mouth/Throat:     Mouth: Mucous membranes are moist.  Eyes:     Conjunctiva/sclera: Conjunctivae normal.  Cardiovascular:     Rate and Rhythm: Normal rate and regular rhythm.  Pulmonary:     Effort: Pulmonary effort is normal. No respiratory distress.  Abdominal:     General: There is no distension.     Palpations: Abdomen is soft.  Musculoskeletal:        General: Normal range of motion.     Cervical back: Normal range of motion and neck supple.  Skin:    General: Skin is warm and dry.     Capillary Refill: Capillary refill takes less than 2 seconds.  Neurological:     Mental Status: She is alert and oriented to person, place, and time.     Cranial Nerves: No cranial nerve deficit, dysarthria  or facial asymmetry.     Sensory: No sensory deficit.     Motor: No weakness.     Coordination: Coordination normal.  Psychiatric:        Mood and Affect: Mood normal.        Behavior: Behavior normal.     (all labs ordered are listed, but only abnormal results are displayed) Labs Reviewed  CBC WITH DIFFERENTIAL/PLATELET - Abnormal; Notable for the following components:      Result Value   RBC 3.78 (*)    All other components within normal limits  COMPREHENSIVE METABOLIC PANEL WITH GFR - Abnormal; Notable for the following components:   CO2 20 (*)    Calcium 8.6 (*)    All other components within normal limits  I-STAT CHEM 8, ED - Abnormal; Notable for the following components:   Calcium, Ion 1.06 (*)    TCO2 21 (*)    All other  components within normal limits  HCG, SERUM, QUALITATIVE    EKG: None  Radiology: CT HEAD WO CONTRAST Result Date: 10/19/2023 CLINICAL DATA:  Headache, increasing frequency or severity EXAM: CT HEAD WITHOUT CONTRAST TECHNIQUE: Contiguous axial images were obtained from the base of the skull through the vertex without intravenous contrast. RADIATION DOSE REDUCTION: This exam was performed according to the departmental dose-optimization program which includes automated exposure control, adjustment of the mA and/or kV according to patient size and/or use of iterative reconstruction technique. COMPARISON:  08/15/2019 FINDINGS: Brain: No acute intracranial abnormality. Specifically, no hemorrhage, hydrocephalus, mass lesion, acute infarction, or significant intracranial injury. Vascular: No hyperdense vessel or unexpected calcification. Skull: No acute calvarial abnormality. Sinuses/Orbits: No acute findings Other: None IMPRESSION: No acute intracranial abnormality. Electronically Signed   By: Franky Crease M.D.   On: 10/19/2023 22:55     Procedures   Medications Ordered in the ED  ketorolac  (TORADOL ) 15 MG/ML injection 15 mg (15 mg Intravenous Given 10/19/23 2304)  prochlorperazine  (COMPAZINE ) injection 10 mg (10 mg Intravenous Given 10/19/23 2304)  diphenhydrAMINE  (BENADRYL ) injection 12.5 mg (12.5 mg Intravenous Given 10/19/23 2304)  lactated ringers  bolus 1,000 mL (1,000 mLs Intravenous New Bag/Given 10/19/23 2304)                                    Medical Decision Making Amount and/or Complexity of Data Reviewed Radiology: ordered.  Risk Prescription drug management.   Patient presenting for left-sided headache with associated left eye blurry vision.  Symptoms are typical for her migraines.  Onset of headache was 2 days ago.  It has been refractory to over-the-counter medications.  Vital signs on arrival in the ED are normal.  Patient is well-appearing on exam.  Headache cocktail was ordered.   Patient's lab work is unremarkable.  CT imaging did not show any acute findings.  On reassessment, patient sleeping.  She was allowed to rest while in the ED.  On further reassessment, patient was awakened and does report improved headache.  She was discharged in stable condition.     Final diagnoses:  Bad headache    ED Discharge Orders     None          Melvenia Motto, MD 10/19/23 2354

## 2023-10-19 NOTE — ED Triage Notes (Signed)
 Patient in ED today with complaints of vision loss in left eye with a pounding headache. Patient states that she lost her vision at around 4am this morning when she went to work. Patient states the headache started before the vision loss. She also states she was nauseous and vomited twice and had a dizzy spell.  Headache is becoming progressively worse. 10/10 at this time.

## 2023-10-19 NOTE — ED Notes (Signed)
 Patient stated to staff that she prefers quantity over quality for her care, patient wants to know how long she will have to wait.

## 2023-10-19 NOTE — ED Provider Triage Note (Signed)
 Emergency Medicine Provider Triage Evaluation Note  EMNET MONK , a 38 y.o. female  was evaluated in triage.  Pt complains of headache which began around 4 AM this morning approximately 8 hours ago.  Sharp stabbing to the left side with blurry vision to the left eye.  According to records which were reviewed, 3 years ago, along with 5 years ago she had the same presentation but she tells me that this feels different.  She did have neuro follow-up 5 years ago, however has not taken any medications that were prescribed to her.  She has not tried any over-the-counter medications as she was told do not take any oral medications over-the-counter .  Denies any upper or lower extremity weakness.  Review of Systems  Positive: Headache, vision changes Negative: S, numbness, dizziness, nausea, vomiting  Physical Exam  BP 131/77 (BP Location: Right Arm)   Pulse 82   Temp 98.2 F (36.8 C)   Resp 17   Ht 4' 11 (1.499 m)   Wt 54.4 kg   SpO2 100%   BMI 24.24 kg/m  Gen:   Awake, no distress   Resp:  Normal effort  MSK:   Moves extremities without difficulty  Other:    Medical Decision Making  Medically screening exam initiated at 12:05 PM.  Appropriate orders placed.  JANELIE GOLTZ was informed that the remainder of the evaluation will be completed by another provider, this initial triage assessment does not replace that evaluation, and the importance of remaining in the ED until their evaluation is complete.     Steed Kanaan, PA-C 10/19/23 1208

## 2023-10-19 NOTE — ED Triage Notes (Signed)
 Patient states she has a history of migraines and this is 10 times worse. Does not get vision loss with migraines often

## 2023-10-19 NOTE — Discharge Instructions (Addendum)
 Your test results today were reassuring.  Follow-up with your neurologist.  Return to the emergency department for any new or worsening symptoms of concern.

## 2024-03-15 ENCOUNTER — Encounter (HOSPITAL_COMMUNITY): Payer: Self-pay | Admitting: *Deleted

## 2024-03-15 ENCOUNTER — Ambulatory Visit (HOSPITAL_COMMUNITY)
Admission: EM | Admit: 2024-03-15 | Discharge: 2024-03-15 | Disposition: A | Discharge status: Home / Self Care | Source: Home / Self Care | Attending: Family Medicine | Admitting: Family Medicine

## 2024-03-15 DIAGNOSIS — S0993XA Unspecified injury of face, initial encounter: Secondary | ICD-10-CM

## 2024-03-15 MED ORDER — HYDROCODONE-ACETAMINOPHEN 5-325 MG PO TABS: 1.0000 | ORAL_TABLET | Freq: Four times a day (QID) | ORAL | 0 refills | Status: AC | PRN

## 2024-03-15 MED ORDER — CLINDAMYCIN HCL 300 MG PO CAPS: 300.0000 mg | ORAL_CAPSULE | Freq: Three times a day (TID) | ORAL | 0 refills | Status: AC

## 2024-03-15 NOTE — Discharge Instructions (Signed)
 Be aware, you have been prescribed pain medications that may cause drowsiness. While taking this medication, do not take any other medications containing acetaminophen (Tylenol). Do not combine with alcohol or recreational drugs. Please do not drive, operate heavy machinery, or take part in activities that require making important decisions while on this medication as your judgement may be clouded.

## 2024-03-15 NOTE — ED Triage Notes (Signed)
 Pt states she fell on ice and hit the right side of her face and broke her tooth. She has right sided facial pain. She has not taken any meds. She denies LOC
